# Patient Record
Sex: Female | Born: 1945 | Race: White | Hispanic: No | Smoking: Never smoker
Health system: Southern US, Community
[De-identification: ages and names within clinical notes are randomized; demographics above are authoritative.]

## PROBLEM LIST (undated history)

## (undated) DIAGNOSIS — R51 Headache: Secondary | ICD-10-CM

## (undated) DIAGNOSIS — K9089 Other intestinal malabsorption: Secondary | ICD-10-CM

## (undated) DIAGNOSIS — I499 Cardiac arrhythmia, unspecified: Secondary | ICD-10-CM

## (undated) DIAGNOSIS — I10 Essential (primary) hypertension: Secondary | ICD-10-CM

## (undated) DIAGNOSIS — G4730 Sleep apnea, unspecified: Secondary | ICD-10-CM

## (undated) DIAGNOSIS — N390 Urinary tract infection, site not specified: Secondary | ICD-10-CM

## (undated) DIAGNOSIS — Z8489 Family history of other specified conditions: Secondary | ICD-10-CM

## (undated) DIAGNOSIS — D649 Anemia, unspecified: Secondary | ICD-10-CM

## (undated) DIAGNOSIS — W19XXXA Unspecified fall, initial encounter: Secondary | ICD-10-CM

## (undated) DIAGNOSIS — I8390 Asymptomatic varicose veins of unspecified lower extremity: Secondary | ICD-10-CM

## (undated) DIAGNOSIS — M1990 Unspecified osteoarthritis, unspecified site: Secondary | ICD-10-CM

## (undated) DIAGNOSIS — F419 Anxiety disorder, unspecified: Secondary | ICD-10-CM

## (undated) HISTORY — PX: HERNIA REPAIR: SHX51

## (undated) HISTORY — DX: Urinary tract infection, site not specified: N39.0

## (undated) HISTORY — DX: Essential (primary) hypertension: I10

## (undated) HISTORY — DX: Asymptomatic varicose veins of unspecified lower extremity: I83.90

## (undated) HISTORY — DX: Other intestinal malabsorption: K90.89

## (undated) HISTORY — PX: NASAL SINUS SURGERY: SHX719

## (undated) HISTORY — PX: TONSILLECTOMY: SUR1361

## (undated) HISTORY — PX: CARPAL TUNNEL RELEASE: SHX101

---

## 1987-02-09 HISTORY — PX: ABDOMINAL HYSTERECTOMY: SHX81

## 1998-02-08 HISTORY — PX: GASTRIC BYPASS: SHX52

## 1998-02-08 HISTORY — PX: CYSTECTOMY: SUR359

## 1998-02-08 HISTORY — PX: CHOLECYSTECTOMY: SHX55

## 2006-02-08 HISTORY — PX: COLONOSCOPY: SHX174

## 2011-08-19 ENCOUNTER — Other Ambulatory Visit: Payer: Self-pay

## 2011-08-19 DIAGNOSIS — I83893 Varicose veins of bilateral lower extremities with other complications: Secondary | ICD-10-CM

## 2011-09-03 ENCOUNTER — Encounter: Payer: Self-pay | Admitting: Vascular Surgery

## 2011-09-15 ENCOUNTER — Encounter: Payer: Medicare Other | Admitting: Vascular Surgery

## 2011-10-21 ENCOUNTER — Encounter: Payer: Self-pay | Admitting: Vascular Surgery

## 2011-10-22 ENCOUNTER — Ambulatory Visit (INDEPENDENT_AMBULATORY_CARE_PROVIDER_SITE_OTHER): Payer: Medicare Other | Admitting: Vascular Surgery

## 2011-10-22 ENCOUNTER — Encounter (INDEPENDENT_AMBULATORY_CARE_PROVIDER_SITE_OTHER): Payer: Medicare Other | Admitting: *Deleted

## 2011-10-22 ENCOUNTER — Encounter: Payer: Self-pay | Admitting: Vascular Surgery

## 2011-10-22 VITALS — BP 143/87 | HR 73 | Resp 16 | Ht 63.5 in | Wt 214.4 lb

## 2011-10-22 DIAGNOSIS — M79604 Pain in right leg: Secondary | ICD-10-CM

## 2011-10-22 DIAGNOSIS — M79609 Pain in unspecified limb: Secondary | ICD-10-CM

## 2011-10-22 DIAGNOSIS — I83893 Varicose veins of bilateral lower extremities with other complications: Secondary | ICD-10-CM

## 2011-10-22 DIAGNOSIS — I872 Venous insufficiency (chronic) (peripheral): Secondary | ICD-10-CM

## 2011-10-22 DIAGNOSIS — R609 Edema, unspecified: Secondary | ICD-10-CM

## 2011-10-22 NOTE — Progress Notes (Signed)
[facility] [place]  Referred by:  Philemon Kingdom, MD [address] [place], [place] [address]  Reason for referral: R > L varicose veins  History of Present Illness  Rachel Castaneda is a 66 y.o. (Jul 04, 1945) female who presents with chief complaint: swollen leg.  Patient notes, onset of swelling >10 years ago, associated with no obvious trigger.  The patient has had no history of DVT, known history of varicose vein, no history of venous stasis ulcers, no history of  Lymphedema and no history of skin changes in lower legs.  There is known family history of venous disorders.  The patient has used OTC compression stockings in the past.  Past Medical History  Diagnosis Date  . Varicose veins   . Hypertension   . UTI (urinary tract infection)   . Diabetes mellitus     Type II  . Other specified intestinal malabsorption     Past Surgical History  Procedure Date  . Hernia repair 1997  and   2001  . Cystectomy 2000    Sinus cyst  . Gastric bypass 2000  . Cholecystectomy 2000    Gall Bladder  . Colonoscopy 2008  . Abdominal hysterectomy 1989    History   Social History  . Marital Status: Divorced    Spouse Name: N/A    Number of Children: N/A  . Years of Education: N/A   Occupational History  . Not on file.   Social History Main Topics  . Smoking status: Never Smoker   . Smokeless tobacco: Not on file  . Alcohol Use: No  . Drug Use: No  . Sexually Active:    Other Topics Concern  . Not on file   Social History Narrative  . No narrative on file    Family History  Problem Relation Age of Onset  . Cancer Mother     Skin cancer  . Stroke Mother   . Hypertension Mother     Current Outpatient Prescriptions on File Prior to Visit  Medication Sig Dispense Refill  . acetaminophen (TYLENOL) 500 MG tablet Take 500 mg by mouth every 6 (six) hours as needed.      Marland Kitchen aspirin 81 MG tablet Take 81 mg by mouth daily.      . Biotin 1000 MCG tablet Take  1,000 mcg by mouth 3 (three) times daily.      . Calcium Carbonate-Vitamin D 300-100 MG-UNIT CAPS Take 400 Units by mouth.      . Cyanocobalamin (VITAMIN B 12 PO) Take 2,500 mcg by mouth.      . ferrous sulfate 325 (65 FE) MG EC tablet Take 325 mg by mouth 3 (three) times daily with meals.      Marland Kitchen ibuprofen (ADVIL,MOTRIN) 200 MG tablet Take 200 mg by mouth every 6 (six) hours as needed.      . Multiple Vitamin (MULTIVITAMIN) tablet Take 1 tablet by mouth daily.      . Potassium 99 MG TABS Take by mouth.        Allergies  Allergen Reactions  . Codeine Hives    REVIEW OF SYSTEMS:  (Positives checked otherwise negative)  CARDIOVASCULAR:  [ ]  chest pain, [ ]  chest pressure, [ ]  palpitations, [ ]  shortness of breath when laying flat, [ ]  shortness of breath with exertion,   [x]  pain in feet when walking, [ ]  pain in feet when laying flat, [ ]  history of blood clot in veins (DVT), [ ]  history of  phlebitis, [x]  swelling in legs, [x]  varicose veins  PULMONARY:  [ ]  productive cough, [ ]  asthma, [ ]  wheezing  NEUROLOGIC:  [ ]  weakness in arms or legs, [ ]  numbness in arms or legs, [ ]  difficulty speaking or slurred speech, [ ]  temporary loss of vision in one eye, [ ]  dizziness  HEMATOLOGIC:  [ ]  bleeding problems, [ ]  problems with blood clotting too easily  MUSCULOSKEL:  [ ]  joint pain, [ ]  joint swelling  GASTROINTEST:  [ ]   Vomiting blood, [ ]   Blood in stool     GENITOURINARY:  [ ]   Burning with urination, [ ]   Blood in urine  PSYCHIATRIC:  [ ]  history of major depression  INTEGUMENTARY:  [ ]  rashes, [ ]  ulcers  CONSTITUTIONAL:  [ ]  fever, [ ]  chills  Physical Examination  Filed Vitals:   10/22/11 1536  BP: 143/87  Pulse: 73  Resp: 16  Height: 5' 3.5" (1.613 m)  Weight: 214 lb 6.4 oz (97.251 kg)  SpO2: 96%   Body mass index is 37.38 kg/(m^2).  General: A&O x 3, WDWN, obese  Head: [place]/AT  Ear/Nose/Throat: Hearing grossly intact, nares w/o erythema or drainage, oropharynx  w/o Erythema/Exudate  Eyes: PERRLA, EOMI  Neck: Supple, no nuchal rigidity, no palpable LAD  Pulmonary: Sym exp, good air movt, CTAB, no rales, rhonchi, & wheezing  Cardiac: RRR, Nl S1, S2, no Murmurs, rubs or gallops  Vascular: Vessel Right Left  Radial Palpable Palpable  Ulnar Palpable Palpable  Brachial Palpable Palpable  Carotid Palpable, without bruit Palpable, without bruit  Aorta Not palpable N/A  Femoral Palpable Palpable  Popliteal Not palpable Not palpable  PT Palpable Palpable  DP Palpable Palpable   Gastrointestinal: soft, NTND, -G/R, - HSM, - masses, - CVAT B  Musculoskeletal: M/S 5/5 throughout , Extremities without ischemic changes , R > LLE: varicosities, spider veins, no LDS, bulging in L posterior calf  Neurologic: CN 2-12 intact , Pain and light touch intact in extremities , Motor exam as listed above  Psychiatric: Judgment intact, Mood & affect appropriate for pt's clinical situation  Dermatologic: See M/S exam for extremity exam, no rashes otherwise noted  Lymph : No Cervical, Axillary, or Inguinal lymphadenopathy   Non-Invasive Vascular Imaging  BLE Venous Insufficiency Duplex (Date: 10/22/11):   RLE:no DVT, evidence of deep venous reflux and some limited superficial reflux, no SSV reflux  LLE: no DVT, evidence of deep venous reflux and some limited superficial reflux, no SSV reflux  Outside Studies/Documentation 2 pages of outside documents were reviewed including: outpatient chart.  Medical Decision Making  Rachel Castaneda is a 66 y.o. female who presents with: BLE CVI (C2).   Based on the patient's history and examination, I recommend: 3 months of B thigh high compression stockings 20-30 mm Hg..  After three month trial of compression stockings, we will have her follow up with Vein Clinic for consideration of EVLA vs phlebectomy  Thank you for allowing [place] to participate in this patient's care.  Leonides Sake, MD [facility] [place] Office: [phone] Pager: [phone]  10/22/2011, 4:02 PM

## 2012-01-25 ENCOUNTER — Ambulatory Visit: Payer: Medicare Other | Admitting: Vascular Surgery

## 2012-02-09 HISTORY — PX: BACK SURGERY: SHX140

## 2012-02-18 ENCOUNTER — Other Ambulatory Visit: Payer: Self-pay | Admitting: Neurosurgery

## 2012-02-18 DIAGNOSIS — M549 Dorsalgia, unspecified: Secondary | ICD-10-CM

## 2012-02-21 ENCOUNTER — Ambulatory Visit
Admission: RE | Admit: 2012-02-21 | Discharge: 2012-02-21 | Disposition: A | Discharge status: Home / Self Care | Payer: Medicare Other | Source: Ambulatory Visit | Attending: Neurosurgery | Admitting: Neurosurgery

## 2012-02-21 VITALS — BP 155/75 | HR 78

## 2012-02-21 DIAGNOSIS — M549 Dorsalgia, unspecified: Secondary | ICD-10-CM

## 2012-02-21 MED ORDER — IOHEXOL 180 MG/ML  SOLN
1.0000 mL | Freq: Once | INTRAMUSCULAR | Status: AC | PRN
  Administered 2012-02-21: 1 mL via EPIDURAL

## 2012-02-21 MED ORDER — DIAZEPAM 5 MG PO TABS
5.0000 mg | ORAL_TABLET | Freq: Once | ORAL | Status: AC
  Administered 2012-02-21: 5 mg via ORAL

## 2012-02-21 MED ORDER — METHYLPREDNISOLONE ACETATE 40 MG/ML INJ SUSP (RADIOLOG
120.0000 mg | Freq: Once | INTRAMUSCULAR | Status: AC
  Administered 2012-02-21: 120 mg via EPIDURAL

## 2012-03-20 ENCOUNTER — Other Ambulatory Visit: Payer: Self-pay | Admitting: Neurosurgery

## 2012-03-31 ENCOUNTER — Encounter (HOSPITAL_COMMUNITY): Payer: Self-pay | Admitting: Respiratory Therapy

## 2012-04-06 ENCOUNTER — Encounter (HOSPITAL_COMMUNITY): Payer: Self-pay

## 2012-04-06 ENCOUNTER — Encounter (HOSPITAL_COMMUNITY)
Admission: RE | Admit: 2012-04-06 | Discharge: 2012-04-06 | Disposition: A | Discharge status: Home / Self Care | Payer: Medicare Other | Source: Ambulatory Visit | Attending: Neurosurgery | Admitting: Neurosurgery

## 2012-04-06 HISTORY — DX: Anemia, unspecified: D64.9

## 2012-04-06 HISTORY — DX: Sleep apnea, unspecified: G47.30

## 2012-04-06 HISTORY — DX: Family history of other specified conditions: Z84.89

## 2012-04-06 HISTORY — DX: Headache: R51

## 2012-04-06 HISTORY — DX: Unspecified osteoarthritis, unspecified site: M19.90

## 2012-04-06 HISTORY — DX: Anxiety disorder, unspecified: F41.9

## 2012-04-06 HISTORY — DX: Unspecified fall, initial encounter: W19.XXXA

## 2012-04-06 LAB — BASIC METABOLIC PANEL
BUN: 15 mg/dL (ref 6–23)
CO2: 32 mEq/L (ref 19–32)
Calcium: 9.5 mg/dL (ref 8.4–10.5)
Chloride: 103 mEq/L (ref 96–112)
Creatinine, Ser: 0.54 mg/dL (ref 0.50–1.10)
GFR calc Af Amer: >90 mL/min (ref >90)
GFR calc non Af Amer: >90 mL/min (ref >90)
Glucose, Bld: 99 mg/dL (ref 70–99)
Potassium: 4.4 mEq/L (ref 3.5–5.1)
Sodium: 142 mEq/L (ref 135–145)

## 2012-04-06 LAB — CBC
HCT: 42.3 % (ref 36.0–46.0)
Hemoglobin: 15 g/dL (ref 12.0–15.0)
MCH: 33.6 pg (ref 26.0–34.0)
MCHC: 35.5 g/dL (ref 30.0–36.0)
MCV: 94.6 fL (ref 78.0–100.0)
Platelets: 217 10*3/uL (ref 150–400)
RBC: 4.47 MIL/uL (ref 3.87–5.11)
RDW: 12 % (ref 11.5–15.5)
WBC: 7.7 10*3/uL (ref 4.0–10.5)

## 2012-04-06 LAB — TYPE AND SCREEN
ABO/RH(D): O POS
Antibody Screen: NEGATIVE

## 2012-04-06 LAB — ABO/RH: ABO/RH(D): O POS

## 2012-04-06 LAB — SURGICAL PCR SCREEN
MRSA, PCR: NEGATIVE
Staphylococcus aureus: NEGATIVE

## 2012-04-06 NOTE — Pre-Procedure Instructions (Signed)
ALAHNA DUNNE  04/06/2012   Your procedure is scheduled on:  04/14/2012, Friday  Report to [facility] at 5:30 AM.  Call this number if you have problems the morning of surgery: [phone]   Remember:   Do not eat food or drink liquids after midnight.  On THursday    Take these medicines the morning of surgery with A SIP OF WATER: pain medicine if desired    Do not wear jewelry, make-up or nail polish.  Do not wear lotions, powders, or perfumes. You may wear deodorant.  Do not shave 48 hours prior to surgery. Men may shave face and neck.  Do not bring valuables to the hospital.  Contacts, dentures or bridgework may not be worn into surgery.  Leave suitcase in the car. After surgery it may be brought to your room.  For patients admitted to the hospital, checkout time is 11:00 AM the day of  discharge.   Patients discharged the day of surgery will not be allowed to drive  home.  Name and phone number of your driver: /w family  Special Instructions: Shower using CHG 2 nights before surgery and the night before surgery.  If you shower the day of surgery use CHG.  Use special wash - you have one bottle of CHG for all showers.  You should use approximately 1/3 of the bottle for each shower.   Please read over the following fact sheets that you were given: Pain Booklet, Coughing and Deep Breathing, Blood Transfusion Information, MRSA Information and Surgical Site Infection Prevention

## 2012-04-06 NOTE — Progress Notes (Signed)
Spoke with Shanda Bumps, reporting orders need to be signed.

## 2012-04-06 NOTE — Progress Notes (Signed)
Call to [facility] for Dr. Jeral Fruit to sign orders, on hold extended period of the time.

## 2012-04-06 NOTE — Progress Notes (Signed)
Pt. Reports her last EKG was several yrs. Ago in [place].

## 2012-04-06 NOTE — Progress Notes (Signed)
Call to MD office, related to Dr. Jeral Fruit needing to sign the orders.

## 2012-04-06 NOTE — Progress Notes (Signed)
Pt. Denies cardiac symptoms, has already stopped aspirin.

## 2012-04-07 NOTE — Progress Notes (Signed)
Call from pt. remarking that last night she experienced awaking gasping for air. Pt. Concerned that she has periodic episodes of apnea. Pt. Reassured that she will be given resp. support for her needs accordingly while in the [facility]. Pt. Encouraged to report the same to the asnesthesia team DOS.

## 2012-04-10 NOTE — Progress Notes (Signed)
1256.Marland KitchenMarland KitchenI spoke with Shanda Bumps @ office to have Dr. Jeral Fruit sign  And release orders.........da

## 2012-04-14 ENCOUNTER — Encounter (HOSPITAL_COMMUNITY): Payer: Self-pay | Admitting: *Deleted

## 2012-04-14 ENCOUNTER — Inpatient Hospital Stay (HOSPITAL_COMMUNITY)
Admission: RE | Admit: 2012-04-14 | Discharge: 2012-04-18 | DRG: 460 | Disposition: A | Discharge status: Home Health | Payer: Medicare Other | Source: Ambulatory Visit | Attending: Neurosurgery | Admitting: Neurosurgery

## 2012-04-14 ENCOUNTER — Inpatient Hospital Stay (HOSPITAL_COMMUNITY): Payer: Medicare Other

## 2012-04-14 ENCOUNTER — Inpatient Hospital Stay (HOSPITAL_COMMUNITY): Payer: Medicare Other | Admitting: Anesthesiology

## 2012-04-14 ENCOUNTER — Encounter (HOSPITAL_COMMUNITY): Payer: Self-pay | Admitting: Anesthesiology

## 2012-04-14 ENCOUNTER — Encounter (HOSPITAL_COMMUNITY)
Admission: RE | Disposition: A | Discharge status: Home Health | Payer: Self-pay | Source: Ambulatory Visit | Attending: Neurosurgery

## 2012-04-14 DIAGNOSIS — Z7982 Long term (current) use of aspirin: Secondary | ICD-10-CM

## 2012-04-14 DIAGNOSIS — Z01812 Encounter for preprocedural laboratory examination: Secondary | ICD-10-CM

## 2012-04-14 DIAGNOSIS — Q762 Congenital spondylolisthesis: Principal | ICD-10-CM

## 2012-04-14 DIAGNOSIS — Z79899 Other long term (current) drug therapy: Secondary | ICD-10-CM

## 2012-04-14 DIAGNOSIS — M7130 Other bursal cyst, unspecified site: Secondary | ICD-10-CM | POA: Diagnosis present

## 2012-04-14 DIAGNOSIS — Z9884 Bariatric surgery status: Secondary | ICD-10-CM

## 2012-04-14 DIAGNOSIS — D649 Anemia, unspecified: Secondary | ICD-10-CM | POA: Diagnosis present

## 2012-04-14 IMAGING — RF DG LUMBAR SPINE 2-3V
1 series · 2 of 2 positions shown · non-contrast
Comparison: MRI lumbar spine [DATE].

CLINICAL DATA: L4-5 fusion.

DG C-ARM 1-60 MIN, LUMBAR SPINE - 2-3 VIEW
TECHNIQUE: Two fluoroscopic intraoperative spot views of the lower
lumbar spine are provided.

[Series 1: run · 2 of 2 slices shown]
[im 1/2]
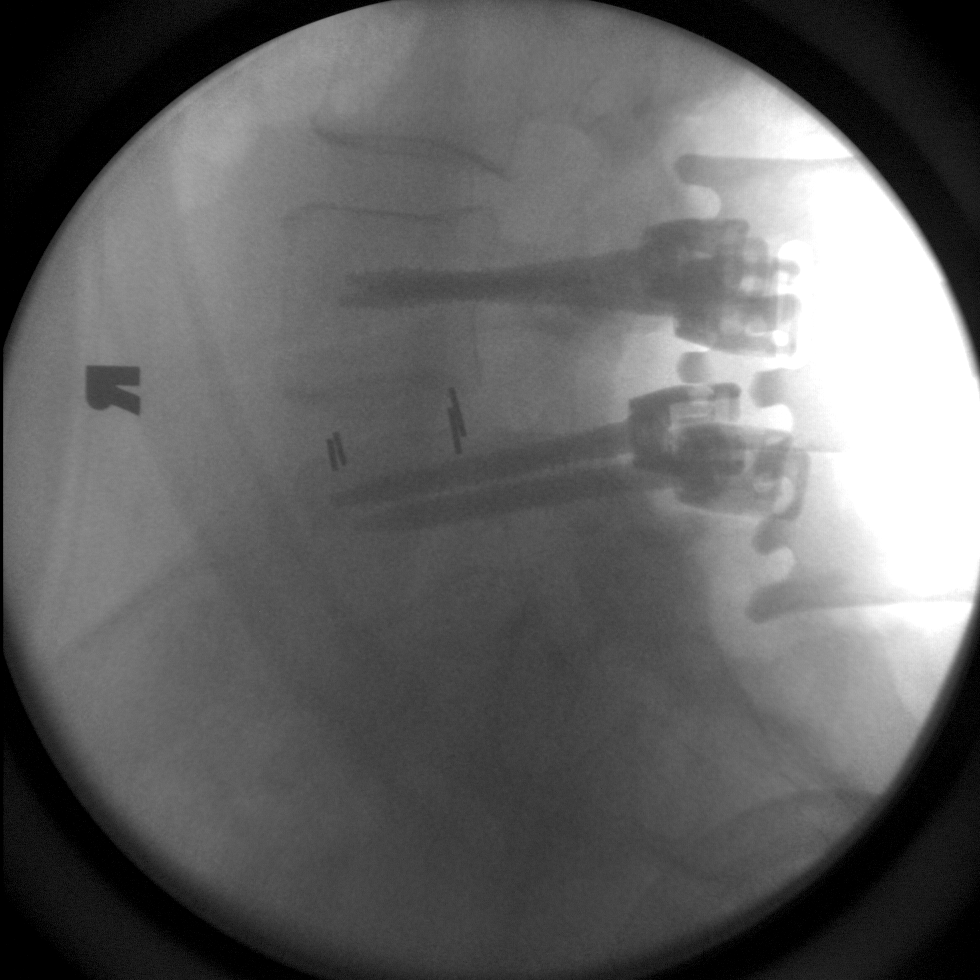
[im 2/2]
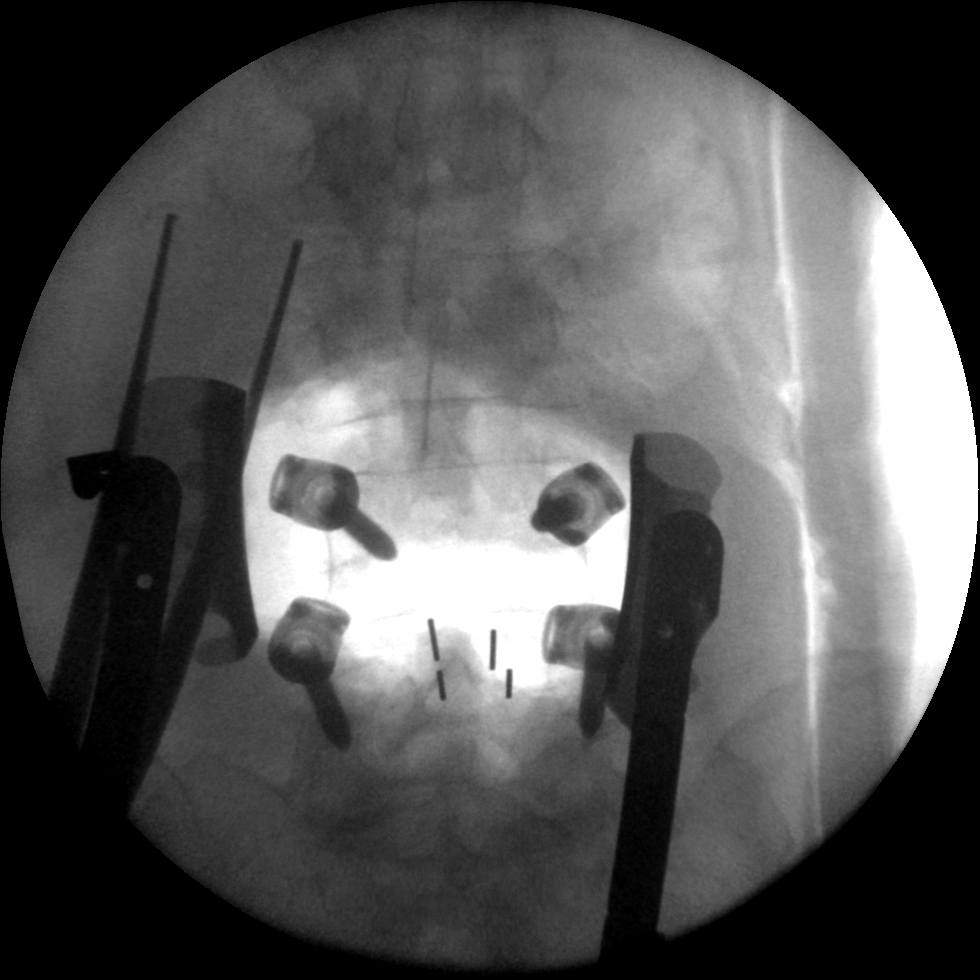

[2 of 2 positions shown; findings below may reference images not displayed]

FINDINGS: Provided images demonstrate pedicle screws at L4-5 with
interbody spacer in place.  Trace anterolisthesis of L4-L5 is not
visible on this examination. There is no fracture.
IMPRESSION: L4-5 fusion in progress.

## 2012-04-14 SURGERY — POSTERIOR LUMBAR FUSION 1 LEVEL
Anesthesia: General | Site: Back | Wound class: Clean

## 2012-04-14 MED ORDER — OXYCODONE HCL 5 MG PO TABS
ORAL_TABLET | ORAL | Status: AC
  Filled 2012-04-14: qty 1

## 2012-04-14 MED ORDER — PROMETHAZINE HCL 25 MG/ML IJ SOLN: 6.2500 mg | INTRAMUSCULAR | Status: DC | PRN

## 2012-04-14 MED ORDER — CEFAZOLIN SODIUM 1-5 GM-% IV SOLN
1.0000 g | Freq: Three times a day (TID) | INTRAVENOUS | Status: AC
  Administered 2012-04-14 – 2012-04-15 (×2): 1 g via INTRAVENOUS
  Filled 2012-04-14 (×2): qty 50

## 2012-04-14 MED ORDER — ACETAMINOPHEN 10 MG/ML IV SOLN
INTRAVENOUS | Status: AC
  Filled 2012-04-14: qty 100

## 2012-04-14 MED ORDER — SODIUM CHLORIDE 0.9 % IJ SOLN: 9.0000 mL | INTRAMUSCULAR | Status: DC | PRN

## 2012-04-14 MED ORDER — CEFAZOLIN SODIUM-DEXTROSE 2-3 GM-% IV SOLR
INTRAVENOUS | Status: AC
  Administered 2012-04-14: 2 g via INTRAVENOUS
  Filled 2012-04-14: qty 50

## 2012-04-14 MED ORDER — ROCURONIUM BROMIDE 100 MG/10ML IV SOLN
INTRAVENOUS | Status: DC | PRN
  Administered 2012-04-14 (×2): 10 mg via INTRAVENOUS
  Administered 2012-04-14: 50 mg via INTRAVENOUS

## 2012-04-14 MED ORDER — OXYCODONE HCL 5 MG/5ML PO SOLN: 5.0000 mg | Freq: Once | ORAL | Status: AC | PRN

## 2012-04-14 MED ORDER — SENNA 8.6 MG PO TABS
1.0000 | ORAL_TABLET | Freq: Two times a day (BID) | ORAL | Status: DC
  Administered 2012-04-14 – 2012-04-17 (×7): 8.6 mg via ORAL
  Filled 2012-04-14 (×11): qty 1

## 2012-04-14 MED ORDER — ARTIFICIAL TEARS OP OINT
TOPICAL_OINTMENT | OPHTHALMIC | Status: DC | PRN
  Administered 2012-04-14: 1 via OPHTHALMIC

## 2012-04-14 MED ORDER — ACETAMINOPHEN 325 MG PO TABS: 650.0000 mg | ORAL_TABLET | ORAL | Status: DC | PRN

## 2012-04-14 MED ORDER — SODIUM CHLORIDE 0.9 % IJ SOLN
3.0000 mL | Freq: Two times a day (BID) | INTRAMUSCULAR | Status: DC
  Administered 2012-04-15 – 2012-04-17 (×3): 3 mL via INTRAVENOUS

## 2012-04-14 MED ORDER — OXYCODONE-ACETAMINOPHEN 5-325 MG PO TABS
1.0000 | ORAL_TABLET | ORAL | Status: DC | PRN
  Administered 2012-04-15 – 2012-04-18 (×12): 2 via ORAL
  Filled 2012-04-14 (×12): qty 2

## 2012-04-14 MED ORDER — DIPHENHYDRAMINE HCL 50 MG/ML IJ SOLN: 12.5000 mg | Freq: Four times a day (QID) | INTRAMUSCULAR | Status: DC | PRN

## 2012-04-14 MED ORDER — DIPHENHYDRAMINE HCL 12.5 MG/5ML PO ELIX: 12.5000 mg | ORAL_SOLUTION | Freq: Four times a day (QID) | ORAL | Status: DC | PRN

## 2012-04-14 MED ORDER — ZOLPIDEM TARTRATE 5 MG PO TABS
5.0000 mg | ORAL_TABLET | Freq: Every evening | ORAL | Status: DC | PRN
  Administered 2012-04-15: 5 mg via ORAL
  Filled 2012-04-14: qty 1

## 2012-04-14 MED ORDER — MORPHINE SULFATE (PF) 1 MG/ML IV SOLN
INTRAVENOUS | Status: AC
  Filled 2012-04-14: qty 25

## 2012-04-14 MED ORDER — HYDROMORPHONE HCL PF 1 MG/ML IJ SOLN
INTRAMUSCULAR | Status: AC
  Filled 2012-04-14: qty 1

## 2012-04-14 MED ORDER — MORPHINE SULFATE (PF) 1 MG/ML IV SOLN
INTRAVENOUS | Status: DC
  Administered 2012-04-14: 14:00:00 via INTRAVENOUS
  Administered 2012-04-14: 4.5 mg via INTRAVENOUS
  Administered 2012-04-14: 21:00:00 via INTRAVENOUS
  Administered 2012-04-14: 10.5 mg via INTRAVENOUS
  Administered 2012-04-14: 6 mg via INTRAVENOUS
  Administered 2012-04-14: 10.48 mg via INTRAVENOUS
  Administered 2012-04-15: 25 mg via INTRAVENOUS
  Administered 2012-04-15: 1.5 mg via INTRAVENOUS
  Administered 2012-04-15: 6 mg via INTRAVENOUS
  Administered 2012-04-15: 14.97 mg via INTRAVENOUS
  Administered 2012-04-15: 7.5 mg via INTRAVENOUS
  Administered 2012-04-15: 9 mg via INTRAVENOUS
  Administered 2012-04-16: 01:00:00 via INTRAVENOUS
  Administered 2012-04-16: 22.45 mg via INTRAVENOUS
  Filled 2012-04-14 (×4): qty 25

## 2012-04-14 MED ORDER — GLYCOPYRROLATE 0.2 MG/ML IJ SOLN
INTRAMUSCULAR | Status: DC | PRN
  Administered 2012-04-14: .6 mg via INTRAVENOUS

## 2012-04-14 MED ORDER — ONDANSETRON HCL 4 MG/2ML IJ SOLN: 4.0000 mg | INTRAMUSCULAR | Status: DC | PRN

## 2012-04-14 MED ORDER — ONDANSETRON HCL 4 MG/2ML IJ SOLN: 4.0000 mg | Freq: Four times a day (QID) | INTRAMUSCULAR | Status: DC | PRN

## 2012-04-14 MED ORDER — ACETAMINOPHEN 650 MG RE SUPP: 650.0000 mg | RECTAL | Status: DC | PRN

## 2012-04-14 MED ORDER — BUPIVACAINE LIPOSOME 1.3 % IJ SUSP
20.0000 mL | INTRAMUSCULAR | Status: AC
  Administered 2012-04-14: 2 mL
  Filled 2012-04-14: qty 20

## 2012-04-14 MED ORDER — SODIUM CHLORIDE 0.9 % IJ SOLN: 3.0000 mL | INTRAMUSCULAR | Status: DC | PRN

## 2012-04-14 MED ORDER — MEPERIDINE HCL 25 MG/ML IJ SOLN: 6.2500 mg | INTRAMUSCULAR | Status: DC | PRN

## 2012-04-14 MED ORDER — HYDROMORPHONE HCL PF 1 MG/ML IJ SOLN
0.2500 mg | INTRAMUSCULAR | Status: DC | PRN
  Administered 2012-04-14 (×4): 0.5 mg via INTRAVENOUS

## 2012-04-14 MED ORDER — NEOSTIGMINE METHYLSULFATE 1 MG/ML IJ SOLN
INTRAMUSCULAR | Status: DC | PRN
  Administered 2012-04-14: 5 mg via INTRAVENOUS

## 2012-04-14 MED ORDER — ONDANSETRON HCL 4 MG/2ML IJ SOLN
INTRAMUSCULAR | Status: DC | PRN
  Administered 2012-04-14: 4 mg via INTRAVENOUS

## 2012-04-14 MED ORDER — PHENOL 1.4 % MT LIQD: 1.0000 | OROMUCOSAL | Status: DC | PRN

## 2012-04-14 MED ORDER — LIDOCAINE HCL (CARDIAC) 20 MG/ML IV SOLN
INTRAVENOUS | Status: DC | PRN
  Administered 2012-04-14: 100 mg via INTRAVENOUS

## 2012-04-14 MED ORDER — LACTATED RINGERS IV SOLN
INTRAVENOUS | Status: DC | PRN
  Administered 2012-04-14 (×3): via INTRAVENOUS

## 2012-04-14 MED ORDER — MIDAZOLAM HCL 5 MG/5ML IJ SOLN
INTRAMUSCULAR | Status: DC | PRN
  Administered 2012-04-14: 2 mg via INTRAVENOUS

## 2012-04-14 MED ORDER — OXYCODONE HCL 5 MG PO TABS
5.0000 mg | ORAL_TABLET | Freq: Once | ORAL | Status: DC | PRN
  Administered 2012-04-14: 5 mg via ORAL

## 2012-04-14 MED ORDER — PROPOFOL 10 MG/ML IV BOLUS
INTRAVENOUS | Status: DC | PRN
  Administered 2012-04-14: 150 mg via INTRAVENOUS

## 2012-04-14 MED ORDER — THROMBIN 20000 UNITS EX SOLR
CUTANEOUS | Status: DC | PRN
  Administered 2012-04-14: 11:00:00 via TOPICAL

## 2012-04-14 MED ORDER — SODIUM CHLORIDE 0.9 % IV SOLN
250.0000 mL | INTRAVENOUS | Status: DC
  Administered 2012-04-14: 250 mL via INTRAVENOUS

## 2012-04-14 MED ORDER — THROMBIN 5000 UNITS EX SOLR
OROMUCOSAL | Status: DC | PRN
  Administered 2012-04-14: 11:00:00 via TOPICAL

## 2012-04-14 MED ORDER — SODIUM CHLORIDE 0.9 % IV SOLN
INTRAVENOUS | Status: DC
  Administered 2012-04-14 – 2012-04-15 (×2): via INTRAVENOUS
  Administered 2012-04-15: 1000 mL via INTRAVENOUS

## 2012-04-14 MED ORDER — FENTANYL CITRATE 0.05 MG/ML IJ SOLN
INTRAMUSCULAR | Status: DC | PRN
  Administered 2012-04-14: 50 ug via INTRAVENOUS
  Administered 2012-04-14: 100 ug via INTRAVENOUS
  Administered 2012-04-14 (×7): 50 ug via INTRAVENOUS

## 2012-04-14 MED ORDER — LUBIPROSTONE 24 MCG PO CAPS
24.0000 ug | ORAL_CAPSULE | Freq: Every day | ORAL | Status: DC
  Administered 2012-04-15 – 2012-04-17 (×3): 24 ug via ORAL
  Filled 2012-04-14 (×5): qty 1

## 2012-04-14 MED ORDER — MENTHOL 3 MG MT LOZG: 1.0000 | LOZENGE | OROMUCOSAL | Status: DC | PRN

## 2012-04-14 MED ORDER — NALOXONE HCL 0.4 MG/ML IJ SOLN: 0.4000 mg | INTRAMUSCULAR | Status: DC | PRN

## 2012-04-14 MED ORDER — 0.9 % SODIUM CHLORIDE (POUR BTL) OPTIME
TOPICAL | Status: DC | PRN
  Administered 2012-04-14: 1000 mL

## 2012-04-14 SURGICAL SUPPLY — 66 items
BENZOIN TINCTURE PRP APPL 2/3 (GAUZE/BANDAGES/DRESSINGS) ×2 IMPLANT
BLADE SURG ROTATE 9660 (MISCELLANEOUS) IMPLANT
BUR ACORN 6.0 (BURR) ×2 IMPLANT
BUR MATCHSTICK NEURO 3.0 LAGG (BURR) ×2 IMPLANT
CANISTER SUCTION 2500CC (MISCELLANEOUS) ×2 IMPLANT
CAP REVERE LOCKING (Cap) ×8 IMPLANT
CLOTH BEACON ORANGE TIMEOUT ST (SAFETY) ×2 IMPLANT
CONT SPEC 4OZ CLIKSEAL STRL BL (MISCELLANEOUS) ×2 IMPLANT
COVER BACK TABLE 24X17X13 BIG (DRAPES) IMPLANT
COVER TABLE BACK 60X90 (DRAPES) ×2 IMPLANT
DRAPE C-ARM 42X72 X-RAY (DRAPES) ×4 IMPLANT
DRAPE LAPAROTOMY 100X72X124 (DRAPES) ×2 IMPLANT
DRAPE POUCH INSTRU U-SHP 10X18 (DRAPES) ×2 IMPLANT
DRSG PAD ABDOMINAL 8X10 ST (GAUZE/BANDAGES/DRESSINGS) ×2 IMPLANT
DURAPREP 26ML APPLICATOR (WOUND CARE) ×2 IMPLANT
ELECT REM PT RETURN 9FT ADLT (ELECTROSURGICAL) ×2
ELECTRODE REM PT RTRN 9FT ADLT (ELECTROSURGICAL) ×1 IMPLANT
EVACUATOR 1/8 PVC DRAIN (DRAIN) IMPLANT
EVACUATOR 3/16  PVC DRAIN (DRAIN) ×1
EVACUATOR 3/16 PVC DRAIN (DRAIN) ×1 IMPLANT
GAUZE SPONGE 4X4 16PLY XRAY LF (GAUZE/BANDAGES/DRESSINGS) ×4 IMPLANT
GLOVE BIOGEL M 8.0 STRL (GLOVE) ×2 IMPLANT
GLOVE BIOGEL PI IND STRL 8.5 (GLOVE) ×1 IMPLANT
GLOVE BIOGEL PI INDICATOR 8.5 (GLOVE) ×1
GLOVE EXAM NITRILE LRG STRL (GLOVE) IMPLANT
GLOVE EXAM NITRILE MD LF STRL (GLOVE) ×2 IMPLANT
GLOVE EXAM NITRILE XL STR (GLOVE) IMPLANT
GLOVE EXAM NITRILE XS STR PU (GLOVE) IMPLANT
GLOVE SURG SS PI 7.5 STRL IVOR (GLOVE) ×6 IMPLANT
GOWN BRE IMP SLV AUR LG STRL (GOWN DISPOSABLE) ×2 IMPLANT
GOWN BRE IMP SLV AUR XL STRL (GOWN DISPOSABLE) IMPLANT
GOWN PREVENTION PLUS XXLARGE (GOWN DISPOSABLE) ×2 IMPLANT
GOWN STRL REIN 2XL LVL4 (GOWN DISPOSABLE) IMPLANT
KIT BASIN OR (CUSTOM PROCEDURE TRAY) ×2 IMPLANT
KIT ROOM TURNOVER OR (KITS) ×2 IMPLANT
MILL MEDIUM DISP (BLADE) ×2 IMPLANT
NEEDLE HYPO 18GX1.5 BLUNT FILL (NEEDLE) IMPLANT
NEEDLE HYPO 21X1.5 SAFETY (NEEDLE) ×2 IMPLANT
NEEDLE HYPO 25X1 1.5 SAFETY (NEEDLE) IMPLANT
NS IRRIG 1000ML POUR BTL (IV SOLUTION) ×2 IMPLANT
PACK LAMINECTOMY NEURO (CUSTOM PROCEDURE TRAY) ×2 IMPLANT
PAD ARMBOARD 7.5X6 YLW CONV (MISCELLANEOUS) ×6 IMPLANT
PATTIES SURGICAL .5 X1 (DISPOSABLE) ×2 IMPLANT
PATTIES SURGICAL .5 X3 (DISPOSABLE) IMPLANT
PUTTY BONE GRAFT KIT 2.5ML (Bone Implant) ×2 IMPLANT
ROD REVERE 6.35 40MM (Rod) ×4 IMPLANT
SCREW REVERE 5.5X45 (Screw) ×8 IMPLANT
SPACER SUSTAIN O SML 8X22 12MM (Spacer) ×4 IMPLANT
SPONGE GAUZE 4X4 12PLY (GAUZE/BANDAGES/DRESSINGS) ×2 IMPLANT
SPONGE LAP 4X18 X RAY DECT (DISPOSABLE) IMPLANT
SPONGE NEURO XRAY DETECT 1X3 (DISPOSABLE) IMPLANT
SPONGE SURGIFOAM ABS GEL 100 (HEMOSTASIS) ×2 IMPLANT
STRIP CLOSURE SKIN 1/2X4 (GAUZE/BANDAGES/DRESSINGS) ×2 IMPLANT
SUT VIC AB 1 CT1 18XBRD ANBCTR (SUTURE) ×2 IMPLANT
SUT VIC AB 1 CT1 8-18 (SUTURE) ×2
SUT VIC AB 2-0 CP2 18 (SUTURE) ×2 IMPLANT
SUT VIC AB 3-0 SH 8-18 (SUTURE) ×2 IMPLANT
SYR 20CC LL (SYRINGE) ×2 IMPLANT
SYR 20ML ECCENTRIC (SYRINGE) ×2 IMPLANT
SYR 5ML LL (SYRINGE) IMPLANT
TAPE CLOTH SURG 4X10 WHT LF (GAUZE/BANDAGES/DRESSINGS) ×2 IMPLANT
TAPE STRIPS DRAPE STRL (GAUZE/BANDAGES/DRESSINGS) ×2 IMPLANT
TOWEL OR 17X24 6PK STRL BLUE (TOWEL DISPOSABLE) ×2 IMPLANT
TOWEL OR 17X26 10 PK STRL BLUE (TOWEL DISPOSABLE) ×2 IMPLANT
TRAY FOLEY CATH 14FRSI W/METER (CATHETERS) ×2 IMPLANT
WATER STERILE IRR 1000ML POUR (IV SOLUTION) ×2 IMPLANT

## 2012-04-14 NOTE — Anesthesia Procedure Notes (Signed)
Procedure Name: Intubation Date/Time: 04/14/2012 9:55 AM Performed by: Sherie Don Pre-anesthesia Checklist: Patient identified, Emergency Drugs available, Suction available, Patient being monitored and Timeout performed Patient Re-evaluated:Patient Re-evaluated prior to inductionOxygen Delivery Method: Circle system utilized Preoxygenation: Pre-oxygenation with 100% oxygen Intubation Type: IV induction Ventilation: Mask ventilation without difficulty Laryngoscope Size: Mac and 3 Grade View: Grade II Tube type: Oral Tube size: 7.5 mm Number of attempts: 1 Airway Equipment and Method: Stylet Placement Confirmation: ETT inserted through vocal cords under direct vision,  positive ETCO2 and breath sounds checked- equal and bilateral Secured at: 23 cm Tube secured with: Tape Dental Injury: Teeth and Oropharynx as per pre-operative assessment

## 2012-04-14 NOTE — Progress Notes (Signed)
Pt has been kept informed of delay.

## 2012-04-14 NOTE — Preoperative (Signed)
Beta Blockers   Reason not to administer Beta Blockers:Not Applicable 

## 2012-04-14 NOTE — Anesthesia Postprocedure Evaluation (Signed)
  Anesthesia Post-op Note  Patient: Rachel Castaneda  Procedure(s) Performed: Procedure(s) with comments: POSTERIOR LUMBAR FUSION 1 LEVEL (N/A) - L4 Gill procedure/L4-5 Diskectomy/fusion/Pedicle screws/posterolateral arthrodesis with autograft/cellsaver  Patient Location: PACU  Anesthesia Type:General  Level of Consciousness: awake and alert   Airway and Oxygen Therapy: Patient Spontanous Breathing  Post-op Pain: mild  Post-op Assessment: Post-op Vital signs reviewed  Post-op Vital Signs: stable  Complications: No apparent anesthesia complications

## 2012-04-14 NOTE — Anesthesia Preprocedure Evaluation (Addendum)
Anesthesia Evaluation  Patient identified by MRN, date of birth, ID band Patient awake    Reviewed: Allergy & Precautions, H&P , NPO status , Patient's Chart, lab work & pertinent test results  History of Anesthesia Complications (+) Family history of anesthesia reaction  Airway Mallampati: I  Neck ROM: Full    Dental  (+) Teeth Intact   Pulmonary sleep apnea ,  breath sounds clear to auscultation        Cardiovascular Rhythm:Regular Rate:Normal     Neuro/Psych  Headaches,    GI/Hepatic negative GI ROS, Neg liver ROS,   Endo/Other  diabetes  Renal/GU      Musculoskeletal   Abdominal   Peds  Hematology negative hematology ROS (+)   Anesthesia Other Findings   Reproductive/Obstetrics                          Anesthesia Physical Anesthesia Plan  ASA: II  Anesthesia Plan: General   Post-op Pain Management:    Induction: Intravenous  Airway Management Planned: Oral ETT  Additional Equipment:   Intra-op Plan:   Post-operative Plan: Extubation in OR  Informed Consent: I have reviewed the patients History and Physical, chart, labs and discussed the procedure including the risks, benefits and alternatives for the proposed anesthesia with the patient or authorized representative who has indicated his/her understanding and acceptance.   Dental advisory given  Plan Discussed with: CRNA and Surgeon  Anesthesia Plan Comments:         Anesthesia Quick Evaluation

## 2012-04-14 NOTE — Transfer of Care (Signed)
Immediate Anesthesia Transfer of Care Note  Patient: Rachel Castaneda  Procedure(s) Performed: Procedure(s) with comments: POSTERIOR LUMBAR FUSION 1 LEVEL (N/A) - L4 Gill procedure/L4-5 Diskectomy/fusion/Pedicle screws/posterolateral arthrodesis with autograft/cellsaver  Patient Location: PACU  Anesthesia Type:General  Level of Consciousness: patient cooperative  Airway & Oxygen Therapy: Patient Spontanous Breathing and Patient connected to face mask oxygen  Post-op Assessment: Report given to PACU RN, Post -op Vital signs reviewed and stable and Patient moving all extremities X 4  Post vital signs: Reviewed and stable  Complications: No apparent anesthesia complications

## 2012-04-14 NOTE — H&P (Signed)
Rachel Castaneda is an 67 y.o. female.   Chief Complaint: lumbar pain HPI: patient complaining of lumbar pain with radiation to the right legfor several years and lately is getting worse.he already had a second opinion and she wants to go ahead with surgery  Past Medical History  Diagnosis Date  . Varicose veins   . UTI (urinary tract infection)   . Other specified intestinal malabsorption   . Family history of anesthesia complication     daughter- extremely agitated   . Anxiety     pt. admits to anxiety, currently, not taking meds.   . Sleep apnea     used CPAP- prior to gastric bypass, no longer needs.  . Diabetes mellitus     Type II, prior to gastric bypass , off Metformin since 2008  . Fall     related to dog attack- Nov. 11.2013  . Headache     h/o migraines   . Arthritis     lumbar region  . Anemia   . Hypertension     HTN- prior to gastric bypass, seen by cardiologist- stress test done, last one mid 1990's- pt. reports it was WNL.    Past Surgical History  Procedure Laterality Date  . Hernia repair  1997  and   2001  . Cystectomy  2000    Sinus cyst  . Gastric bypass  2000  . Cholecystectomy  2000    Gall Bladder  . Colonoscopy  2008  . Abdominal hysterectomy  1989  . Carpal tunnel release      bilateral  . Tonsillectomy    . Cesarean section      x3    Family History  Problem Relation Age of Onset  . Cancer Mother     Skin cancer  . Stroke Mother   . Hypertension Mother    Social History:  reports that she has never smoked. She does not have any smokeless tobacco history on file. She reports that she does not drink alcohol or use illicit drugs.  Allergies:  Allergies  Allergen Reactions  . Codeine Hives  . Dairy Aid (Lactase)     Gassy, rash  . Ibuprofen Itching and Swelling    Medications Prior to Admission  Medication Sig Dispense Refill  . ALPHA LIPOIC ACID PO Take 100 mg by mouth daily.      Marland Kitchen aspirin 81 MG tablet Take 81 mg by mouth  daily.      . Biotin 1000 MCG tablet Take 1,000 mcg by mouth daily.       . calcium carbonate (TUMS EX) 750 MG chewable tablet Chew 2 tablets by mouth 2 (two) times daily.      . cholecalciferol (VITAMIN D) 1000 UNITS tablet Take 1,000 Units by mouth daily.      . Cyanocobalamin (VITAMIN B 12 PO) Take 2,500 mcg by mouth once a week.       . doxycycline (VIBRA-TABS) 100 MG tablet Take 100 mg by mouth daily.      . ferrous sulfate 325 (65 FE) MG EC tablet Take 65 mg by mouth daily with breakfast.       . lubiprostone (AMITIZA) 24 MCG capsule Take 24 mcg by mouth daily with breakfast.       . metroNIDAZOLE (METROGEL) 0.75 % gel Apply 1 application topically daily.      . Multiple Vitamin (MULTIVITAMIN) tablet Take 1 tablet by mouth daily.      Marland Kitchen oxyCODONE-acetaminophen (PERCOCET) 7.5-325 MG per  tablet Take 1 tablet by mouth every 4 (four) hours as needed for pain.      . polyethylene glycol (MIRALAX / GLYCOLAX) packet Take 17 g by mouth daily after supper.       . Potassium 99 MG TABS Take 99 mg by mouth daily.       . Calcium Carbonate-Vitamin D 300-100 MG-UNIT CAPS Take 400 Units by mouth.        No results found for this or any previous visit (from the past 48 hour(s)). No results found.  Review of Systems  Constitutional: Negative.   HENT: Negative.   Eyes: Negative.   Respiratory: Negative.   Cardiovascular:       Arterial hypertension  Gastrointestinal: Positive for nausea.  Genitourinary: Negative.   Musculoskeletal: Positive for back pain.  Skin: Negative.   Neurological: Positive for sensory change and focal weakness.  Psychiatric/Behavioral: Negative.     Blood pressure 156/89, pulse 82, temperature 97.1 F (36.2 C), temperature source Oral, resp. rate 18, SpO2 98.00%. Physical Exam hent, nl, neck, nl. Cv, nl. Lungs, clear. Abdomen, soft. Extremities, nl. NEURO 4/5 df weakness of right foot. Dtr, nl. Sensory, nl. Mri grade 1 spondylolisthesif, facet arthropathy and a  synovial cyst at l4-5   Assessment/Plan Patient to have decompression and fusion at l4-5. She and her daughter who is a Psychologist, occupational are aware of risks and benefits  Rachel Castaneda M 04/14/2012, 9:09 AM

## 2012-04-15 MED ORDER — DIAZEPAM 5 MG PO TABS
5.0000 mg | ORAL_TABLET | Freq: Four times a day (QID) | ORAL | Status: DC | PRN
  Administered 2012-04-15 – 2012-04-18 (×7): 5 mg via ORAL
  Filled 2012-04-15 (×7): qty 1

## 2012-04-15 NOTE — Progress Notes (Signed)
Pt assisted to [facility],  Moving well, tolerated well, voided large amount of clear amber urine.  Returned to bed.

## 2012-04-15 NOTE — Op Note (Signed)
NAME:  Rachel Castaneda, Rachel Castaneda NO.:  [id number]  MEDICAL RECORD NO.:  [id number]  LOCATION:  [id number]                        FACILITY:  [facility]  PHYSICIAN:  Hilda Lias, M.D.   DATE OF BIRTH:  Jul 05, 1945  DATE OF PROCEDURE:  04/14/2012 DATE OF DISCHARGE:                              OPERATIVE REPORT   PREOPERATIVE DIAGNOSIS:  L4-5 spondylolisthesis, severe stenosis, right synovial cyst, and acute and chronic radiculopathy.  POSTOPERATIVE DIAGNOSIS:  L4-5 spondylolisthesis, severe stenosis, right synovial cyst, and acute and chronic radiculopathy.  PROCEDURE:  L4 Gill procedure, which involved removal of the spinous process, lamina facetectomy, bilateral L4-L5 diskectomy medial and laterally more than normal to be able to introduce 2 cages of 12 x 22, decompression of the thecal sac, bilateral foraminotomy, and removal of the synovial cyst attached to the right L5 nerve root.  Pedicle screws at L4-L5, posterolateral arthrodesis with autograft of bone extender.  SURGEON:  Hilda Lias, M.D.  ASSISTANT:  Danae Orleans. Venetia Maxon, M.D.  CLINICAL HISTORY:  Mrs. Rachel Castaneda is a lady who was seen in my office, complaining of back pain worsened on both legs.  The right worse than left one.  She has failed with conservative treatment.  MRI showed that she has severe stenosis at L4-5 with spondylolisthesis grade 1 and large synovial cyst in the right side.  Surgery was advised and the risk was fully explained to her and her daughter who is a Psychologist, occupational.  PROCEDURE IN DETAIL:  The patient was taken to the OR, and after intubation, she was positioned in a prone manner.  The patient's back was cleaned with Betadine and DuraPrep.  Drapes were applied.  Midline incision was made.  We were unable to feel any bony structure.  Incision was carried out all the way down until we found the spinous process and dissection was carried down laterally.  X-ray was done with the C-arm showed that  indeed we were right at the level of L4-5.  From then on, we started the [facility] procedure, removing the spinous process of L4, the lamina of L4 bilaterally, and the facet.  They were quite loose.  The patient had quite a bit of thickening of the yellow ligament and dissection was done.  At the level of L5, there was a large synovial cyst attached to the L5 nerve root.  The dissection was carried down and decompression was achieved.  Then, we entered the disk space 1st in the left side and then the right side with total gross resection medial and laterally.  The endplate were removed.  Then we were able to introduce 2 cages of 12 x 22.  The cages were with autograft and bone extender. Then, using the C-arm 1st in AP view and then a lateral view, we probed the pedicle of L4-L5.  We introduced 4 screws of 5.5 x 45.  Kept in place with a rod and Capps.  Then, we removed the periosteum of the L4-5 disk space laterally as well as the transverse process.  A mix of autograft and bone extender was used for arthrodesis. Valsalva up to 40 was negative.  A Hemovac was left in the AP dural space  and the wound was closed with Vicryl and Steri-Strips.          ______________________________ Hilda Lias, M.D.     EB/MEDQ  D:  04/14/2012  T:  04/15/2012  Job:  [id number]

## 2012-04-15 NOTE — Clinical Social Work Note (Signed)
CSW consult for SNF. PT recommendation for HHPT noted. Per chart review, pt has 24 hour supervision in place. CSW signing off as no other CSW needs identified at this time. Please re-consult if CSW needs arise.  Dellie Burns, MSW, LCSWA [phone] (Weekends 8:00am-4:30pm)

## 2012-04-15 NOTE — Evaluation (Signed)
Physical Therapy Evaluation Patient Details Name: Rachel Castaneda MRN: [id number] DOB: [birth year] Today's Date: 04/15/2012 Time: 7829-5621 PT Time Calculation (min): 31 min  PT Assessment / Plan / Recommendation Clinical Impression  Pt s/p L4-5PLIF with decr mobility secondary to incr pain, decr endurance and decr balance.  Will benefit from PT to address mobility Should progress well and be ok to go home with HHPT f/u and family support.      PT Assessment  Patient needs continued PT services    Follow Up Recommendations  Home health PT;Supervision/Assistance - 24 hour                Equipment Recommendations  Other (comment) (3N1)         Frequency Min 6X/week    Precautions / Restrictions     Pertinent Vitals/Pain VSS, Some pain      Mobility  Bed Mobility Bed Mobility: Rolling Left;Left Sidelying to Sit;Sitting - Scoot to Edge of Bed Rolling Left: 1: +2 Total assist;With rail Rolling Left: Patient Percentage: 70% Left Sidelying to Sit: 1: +2 Total assist;HOB flat;With rails Left Sidelying to Sit: Patient Percentage: 60% Sitting - Scoot to Edge of Bed: 3: Mod assist Details for Bed Mobility Assistance: Pt needed cues for log roll as well as for sequencing.  Used pad to scoot pts hips to EOB.  Pt instructed in back precautions.  Pt assisted with donning brace as well at EOB.   Transfers Transfers: Sit to Stand;Stand to Sit Sit to Stand: 3: Mod assist;With upper extremity assist;From bed Stand to Sit: 4: Min assist;With upper extremity assist;With armrests;To chair/3-in-1 Details for Transfer Assistance: Pt needed cues for hand placement.  Ambulation/Gait Ambulation/Gait Assistance: 4: Min assist Ambulation Distance (Feet): 35 Feet Assistive device: Rolling walker Ambulation/Gait Assistance Details: Pt ambulated with RW with cues needed to stay close to RW.  Pt doing well overall needing cues for safety with RW.  Ambulated into bathroom.  Gait Pattern: Step-to  pattern;Trunk flexed;Decreased stride length;Decreased step length - right;Decreased step length - left;Wide base of support;Antalgic Gait velocity: decreased Stairs: No Wheelchair Mobility Wheelchair Mobility: No         PT Diagnosis: Generalized weakness  PT Problem List: Decreased activity tolerance;Decreased balance;Decreased mobility;Decreased knowledge of use of DME;Decreased safety awareness;Decreased knowledge of precautions;Pain PT Treatment Interventions: DME instruction;Patient/family education;Gait training;Stair training;Functional mobility training;Therapeutic activities;Therapeutic exercise;Balance training   PT Goals Acute Rehab PT Goals PT Goal Formulation: With patient Time For Goal Achievement: 04/22/12 Potential to Achieve Goals: Good Pt will Roll Supine to Left Side: Independently PT Goal: Rolling Supine to Left Side - Progress: Goal set today Pt will go Supine/Side to Sit: Independently PT Goal: Supine/Side to Sit - Progress: Goal set today Pt will go Sit to Stand: Independently;with upper extremity assist PT Goal: Sit to Stand - Progress: Goal set today Pt will Ambulate: >150 feet;with modified independence;with least restrictive assistive device PT Goal: Ambulate - Progress: Goal set today Pt will Go Up / Down Stairs: 3-5 stairs;with modified independence;with least restrictive assistive device PT Goal: Up/Down Stairs - Progress: Goal set today Pt will Perform Home Exercise Program: with supervision, verbal cues required/provided PT Goal: Perform Home Exercise Program - Progress: Goal set today Additional Goals Additional Goal #1: Pt to follow all back precautions.   PT Goal: Additional Goal #1 - Progress: Goal set today  Visit Information  Last PT Received On: 04/15/12 Assistance Needed: +2 PT/OT Co-Evaluation/Treatment: Yes    Subjective Data  Subjective: "I feel so  tired." Patient Stated Goal: To go home.   Prior Functioning  Home Living Lives  With: Spouse Available Help at Discharge: Family;Available 24 hours/day Type of Home: House Home Access: Stairs to enter [employer] of Steps: 4 Entrance Stairs-Rails: Can reach both Home Layout: One level Bathroom Shower/Tub: Engineer, manufacturing systems: Standard Home Adaptive Equipment: Walker - rolling Additional Comments: Has been using RW last few weeks per pt Prior Function Level of Independence: Independent with assistive device(s) Able to Take Stairs?: Yes Communication Communication: No difficulties    Cognition  Cognition Overall Cognitive Status: Appears within functional limits for tasks assessed/performed Arousal/Alertness: Awake/alert Orientation Level: Appears intact for tasks assessed Behavior During Session: [facility] for tasks performed    Extremity/Trunk Assessment Right Lower Extremity Assessment RLE ROM/Strength/Tone: [facility] for tasks assessed Left Lower Extremity Assessment LLE ROM/Strength/Tone: [facility] for tasks assessed Trunk Assessment Trunk Assessment: Normal   Balance Static Standing Balance Static Standing - Balance Support: Bilateral upper extremity supported;During functional activity Static Standing - Level of Assistance: 5: Stand by assistance Static Standing - Comment/# of Minutes: Stood 2 minutes statically with RW with good balance.   End of Session PT - End of Session Equipment Utilized During Treatment: Gait belt;Oxygen Activity Tolerance: Patient limited by fatigue;Patient limited by pain Patient left: in chair;with call bell/phone within reach;with family/visitor present Nurse Communication: Mobility status;Patient requests pain meds;Precautions       INGOLD,DAWN 04/15/2012, 1:18 PM [facility] Acute Rehabilitation [phone] (pager)

## 2012-04-15 NOTE — Plan of Care (Signed)
Problem: Consults Goal: Diagnosis - Spinal Surgery Outcome: Completed/Met Date Met:  04/15/12 [place]/Lumbar Spine Fusion     

## 2012-04-15 NOTE — Evaluation (Signed)
Occupational Therapy Evaluation Patient Details Name: Rachel Castaneda MRN: [id number] DOB: [birth year] Today's Date: 04/15/2012 Time: 1478-2956 OT Time Calculation (min): 32 min  OT Assessment / Plan / Recommendation Clinical Impression  Pt s/p L4-L5 PLIF. Will benefit from acute OT services to address below problem list in prep for return home with family.    OT Assessment  Patient needs continued OT Services    Follow Up Recommendations  No OT follow up;Supervision/Assistance - 24 hour    Barriers to Discharge None    Equipment Recommendations  3 in 1 bedside comode    Recommendations for Other Services    Frequency  Min 2X/week    Precautions / Restrictions     Pertinent Vitals/Pain See vitals    ADL  Eating/Feeding: Performed;Independent Where Assessed - Eating/Feeding: Chair Grooming: Performed;Wash/dry hands;Supervision/safety Where Assessed - Grooming: Unsupported standing Upper Body Bathing: Simulated;Supervision/safety Where Assessed - Upper Body Bathing: Unsupported sitting Lower Body Bathing: Simulated;Moderate assistance Where Assessed - Lower Body Bathing: Supported sit to stand Upper Body Dressing: Performed;Minimal assistance Where Assessed - Upper Body Dressing: Unsupported sitting Lower Body Dressing: Performed;Maximal assistance Where Assessed - Lower Body Dressing: Supported sit to stand Toilet Transfer: Performed;Minimal assistance Toilet Transfer Method: Sit to Barista: Raised toilet seat with arms (or 3-in-1 over toilet) Toileting - Clothing Manipulation and Hygiene: Performed;Min guard;Maximal assistance Where Assessed - Engineer, mining and Hygiene: Sit to stand from 3-in-1 or toilet Equipment Used: Gait belt;Rolling walker;Back brace Transfers/Ambulation Related to ADLs: min guard with RW ADL Comments: Pt donned back brace with mod assist while sitting EOB.  Min guard for standing during front peri care, but  required max assist for back peri care.  Discussed use of toilet aid for toileting hygiene. Will perform AE education next session.    OT Diagnosis: Generalized weakness;Acute pain  OT Problem List: Decreased strength;Decreased activity tolerance;Decreased safety awareness;Decreased knowledge of use of DME or AE;Decreased knowledge of precautions;Pain OT Treatment Interventions: Self-care/ADL training;DME and/or AE instruction;Therapeutic activities;Patient/family education;Balance training   OT Goals Acute Rehab OT Goals OT Goal Formulation: With patient Time For Goal Achievement: 04/22/12 Potential to Achieve Goals: Good ADL Goals Pt Will Perform Lower Body Bathing: with supervision;Sit to stand from chair;Sit to stand from bed;with adaptive equipment ADL Goal: Lower Body Bathing - Progress: Goal set today Pt Will Perform Lower Body Dressing: with supervision;Sit to stand from chair;Sit to stand from bed;with adaptive equipment ADL Goal: Lower Body Dressing - Progress: Goal set today Pt Will Transfer to Toilet: with supervision;Ambulation;with DME;Comfort height toilet;Maintaining back safety precautions ADL Goal: Toilet Transfer - Progress: Goal set today Pt Will Perform Toileting - Clothing Manipulation: with supervision;Standing ADL Goal: Toileting - Clothing Manipulation - Progress: Goal set today Pt Will Perform Toileting - Hygiene: with supervision;Sit to stand from 3-in-1/toilet;with adaptive equipment ADL Goal: Toileting - Hygiene - Progress: Goal set today Miscellaneous OT Goals Miscellaneous OT Goal #1: Pt will perform bed mobility at supervision level as precursor for EOB ADL retraining. OT Goal: Miscellaneous Goal #1 - Progress: Goal set today  Visit Information  Last OT Received On: 04/15/12 Assistance Needed: +2 PT/OT Co-Evaluation/Treatment: Yes    Subjective Data      Prior Functioning     Home Living Lives With: Spouse Available Help at Discharge:  Family;Available 24 hours/day Type of Home: House Home Access: Stairs to enter [employer] of Steps: 4 Entrance Stairs-Rails: Can reach both Home Layout: One level Bathroom Shower/Tub: Engineer, manufacturing systems: Standard Home  Adaptive Equipment: Walker - rolling Additional Comments: Has been using RW last few weeks per pt Prior Function Level of Independence: Independent with assistive device(s) Able to Take Stairs?: Yes Communication Communication: No difficulties Dominant Hand: Right         Vision/Perception     Cognition  Cognition Overall Cognitive Status: Appears within functional limits for tasks assessed/performed Arousal/Alertness: Awake/alert Orientation Level: Appears intact for tasks assessed Behavior During Session: [facility] for tasks performed    Extremity/Trunk Assessment Right Upper Extremity Assessment RUE ROM/Strength/Tone: [facility] for tasks assessed Left Upper Extremity Assessment LUE ROM/Strength/Tone: WFL for tasks assessed Right Lower Extremity Assessment RLE ROM/Strength/Tone: [facility] for tasks assessed Left Lower Extremity Assessment LLE ROM/Strength/Tone: [facility] for tasks assessed Trunk Assessment Trunk Assessment: Normal     Mobility Bed Mobility Bed Mobility: Rolling Left;Left Sidelying to Sit;Sitting - Scoot to Edge of Bed Rolling Left: 1: +2 Total assist;With rail Rolling Left: Patient Percentage: 70% Left Sidelying to Sit: 1: +2 Total assist;HOB flat;With rails Left Sidelying to Sit: Patient Percentage: 60% Sitting - Scoot to Edge of Bed: 3: Mod assist Details for Bed Mobility Assistance: Pt needed cues for log roll as well as for sequencing.  Used pad to scoot pts hips to EOB.  Pt instructed in back precautions.  Pt assisted with donning brace as well at EOB.   Transfers Transfers: Sit to Stand;Stand to Sit Sit to Stand: 3: Mod assist;4: Min assist;From bed;From chair/3-in-1;With armrests;With upper extremity assist Stand to Sit:  4: Min assist;To chair/3-in-1;With armrests;With upper extremity assist Details for Transfer Assistance: Mod assist from bed; min assist from 3n1 over toilet. VCs for safe hand placement.     Exercise     Balance Static Standing Balance Static Standing - Balance Support: Bilateral upper extremity supported;During functional activity Static Standing - Level of Assistance: 5: Stand by assistance Static Standing - Comment/# of Minutes: 2 min   End of Session OT - End of Session Equipment Utilized During Treatment: Gait belt;Back brace Activity Tolerance: Patient tolerated treatment well Patient left: in chair;with call bell/phone within reach;with family/visitor present Nurse Communication: Mobility status  GO    04/15/2012 Cipriano Mile OTR/L Pager [phone] Office [phone]' Cipriano Mile 04/15/2012, 3:11 PM

## 2012-04-15 NOTE — Progress Notes (Signed)
Patient ID: Rachel Castaneda, female   DOB: 03/27/1945, 67 y.o.   MRN: [id number] Having lumbar spasms, no weakness. Drain working well, no sensory changes. Spoke with her exhusband. Show them the operative xrays

## 2012-04-16 MED ORDER — WHITE PETROLATUM GEL
Status: AC
  Administered 2012-04-16: 20:00:00
  Filled 2012-04-16: qty 5

## 2012-04-16 MED ORDER — OXYCODONE HCL 5 MG PO TABS: 5.0000 mg | ORAL_TABLET | ORAL | Status: DC | PRN

## 2012-04-16 NOTE — Progress Notes (Signed)
OT Cancellation Note  Patient Details Name: Rachel Castaneda MRN: [id number] DOB: Jul 11, 1945   Cancelled Treatment:    Reason Eval/Treat Not Completed: Fatigue/lethargy limiting ability to participate. Pt falling asleep while talking to OT and unable to remain awake for session.  Will return next date to perform AE education.  04/16/2012 Cipriano Mile OTR/L Pager [phone] Office [phone]

## 2012-04-16 NOTE — Progress Notes (Signed)
Subjective: Patient reports doing well  Objective: Vital signs in last 24 hours: Temp:  [98.9 F (37.2 C)-100 F (37.8 C)] 98.9 F (37.2 C) (03/09 0200) Pulse Rate:  [85-122] 105 (03/09 0200) Resp:  [15-18] 16 (03/09 0200) BP: (91-127)/(51-82) 127/82 mmHg (03/09 0200) SpO2:  [93 %-100 %] 95 % (03/09 0200)  Intake/Output from previous day: 03/08 0701 - 03/09 0700 In: 840 [P.O.:840] Out: 1580 [Urine:1400; Drains:180] Intake/Output this shift:    Physical Exam: Full strength bilateral lower extremities.  Dressing CDI  Lab Results: No results found for this basename: WBC, HGB, HCT, PLT,  in the last 72 hours BMET No results found for this basename: NA, K, CL, CO2, GLUCOSE, BUN, CREATININE, CALCIUM,  in the last 72 hours  Studies/Results: Dg Lumbar Spine 2-3 Views  04/14/2012  *RADIOLOGY REPORT*  Clinical Data: L4-5 fusion.  DG C-ARM 1-60 MIN, LUMBAR SPINE - 2-3 VIEW  Technique: Two fluoroscopic intraoperative spot views of the lower lumbar spine are provided.  Comparison:  MRI lumbar spine 01/03/2012.  Findings: Provided images demonstrate pedicle screws at L4-5 with interbody spacer in place.  Trace anterolisthesis of L4-L5 is not visible on this examination. There is no fracture.  IMPRESSION: L4-5 fusion in progress.   Original Report Authenticated By: Holley Dexter, M.D.    Dg C-arm 1-60 Min  04/14/2012  *RADIOLOGY REPORT*  Clinical Data: L4-5 fusion.  DG C-ARM 1-60 MIN, LUMBAR SPINE - 2-3 VIEW  Technique: Two fluoroscopic intraoperative spot views of the lower lumbar spine are provided.  Comparison:  MRI lumbar spine 01/03/2012.  Findings: Provided images demonstrate pedicle screws at L4-5 with interbody spacer in place.  Trace anterolisthesis of L4-L5 is not visible on this examination. There is no fracture.  IMPRESSION: L4-5 fusion in progress.   Original Report Authenticated By: Holley Dexter, M.D.     Assessment/Plan: D/C drain.  D/C PCA. Oxy IR for pain.  Continue  PT.    LOS: 2 days    Dorian Heckle, MD 04/16/2012, 5:28 AM

## 2012-04-16 NOTE — Progress Notes (Signed)
Physical Therapy Treatment Patient Details Name: Rachel Castaneda MRN: [id number] DOB: 11-28-1945 Today's Date: 04/16/2012 Time: 0919-0950 PT Time Calculation (min): 31 min  PT Assessment / Plan / Recommendation Comments on Treatment Session  Pt making good progress with mobility + PT goals at this date.       Follow Up Recommendations  Home health PT;Supervision/Assistance - 24 hour     Does the patient have the potential to tolerate intense rehabilitation     Barriers to Discharge        Equipment Recommendations  Other (comment) (3-in-1)    Recommendations for Other Services    Frequency Min 6X/week   Plan Discharge plan remains appropriate    Precautions / Restrictions Precautions Precautions: Back Precaution Comments: Reviewed back precautions with pt Required Braces or Orthoses: Spinal Brace;Other Brace/Splint Other Brace/Splint: No back brace ordered in chart, but brace present in room.  used back brace in today's session.   Restrictions Weight Bearing Restrictions: No       Mobility  Bed Mobility Bed Mobility: Rolling Left;Left Sidelying to Sit;Sitting - Scoot to Edge of Bed;Sit to Sidelying Left Rolling Left: 4: Min assist;With rail Left Sidelying to Sit: 4: Min assist;HOB flat;With rails Sitting - Scoot to Edge of Bed: 5: Supervision Sit to Sidelying Left: 4: Min assist;HOB flat Details for Bed Mobility Assistance: Cues for sequencing & technique.  (A) to maintain back precautions when rolling to side, (A) to lift torso to sitting upright, & (A) to lift LE's back into bed.   Transfers Transfers: Sit to Stand;Stand to Sit Sit to Stand: 4: Min assist;With upper extremity assist;From bed;From chair/3-in-1 Stand to Sit: 4: Min guard;With upper extremity assist;With armrests;To chair/3-in-1;To bed Details for Transfer Assistance: cues for hand placement & technique.  (A) to achieve standing, balance, & safety.   Ambulation/Gait Ambulation/Gait Assistance: 4: Min  guard Ambulation Distance (Feet): 120 Feet Assistive device: Rolling walker Ambulation/Gait Assistance Details: Cues for increased step/stride length, tall posture, body positioning inside RW, & encouragement to decrease reliance of UE's on RW.   Gait Pattern: Step-through pattern;Decreased stride length;Decreased weight shift to right;Decreased weight shift to left (decreased step height) Gait velocity: decreased Stairs: No Wheelchair Mobility Wheelchair Mobility: No      PT Goals Acute Rehab PT Goals Time For Goal Achievement: 04/22/12 Potential to Achieve Goals: Good Pt will Roll Supine to Left Side: Independently PT Goal: Rolling Supine to Left Side - Progress: Progressing toward goal Pt will go Supine/Side to Sit: Independently PT Goal: Supine/Side to Sit - Progress: Progressing toward goal Pt will go Sit to Stand: Independently;with upper extremity assist PT Goal: Sit to Stand - Progress: Progressing toward goal Pt will Ambulate: >150 feet;with modified independence;with least restrictive assistive device PT Goal: Ambulate - Progress: Progressing toward goal Pt will Go Up / Down Stairs: 3-5 stairs;with modified independence;with least restrictive assistive device Pt will Perform Home Exercise Program: with supervision, verbal cues required/provided Additional Goals Additional Goal #1: Pt to follow all back precautions.   PT Goal: Additional Goal #1 - Progress: Progressing toward goal  Visit Information  Last PT Received On: 04/16/12 Assistance Needed: +1    Subjective Data      Cognition  Cognition Overall Cognitive Status: Appears within functional limits for tasks assessed/performed Arousal/Alertness: Awake/alert Orientation Level: Appears intact for tasks assessed Behavior During Session: [facility] for tasks performed    Balance     End of Session PT - End of Session Equipment Utilized During Treatment: Gait belt;Back  brace Activity Tolerance: Patient tolerated  treatment well Patient left: in bed;with call bell/phone within reach Nurse Communication: Mobility status     Verdell Face, [place] [phone] 04/16/2012

## 2012-04-17 LAB — GLUCOSE, CAPILLARY: Glucose-Capillary: 104 mg/dL — ABNORMAL HIGH (ref 70–99)

## 2012-04-17 MED ORDER — DIPHENHYDRAMINE-ZINC ACETATE 2-0.1 % EX CREA
TOPICAL_CREAM | Freq: Every day | CUTANEOUS | Status: DC | PRN
  Administered 2012-04-17: 20:00:00 via TOPICAL
  Filled 2012-04-17: qty 28

## 2012-04-17 NOTE — Progress Notes (Signed)
Occupational Therapy Treatment Patient Details Name: DEAIRA LECKEY MRN: [id number] DOB: 1945-05-17 Today's Date: 04/17/2012 Time: 4098-1191 OT Time Calculation (min): 57 min  OT Assessment / Plan / Recommendation Comments on Treatment Session Pt is educated in back precautions related to ADL and use of AE and DME.  Performing at a supervision level.  Will have assist of family and neighbor upon d/c at home.  Daughter to purchase tub seat with back and AE kit.  Needs 3 in1. Needs to practice tub transfer if still here tomorrow.    Follow Up Recommendations  No OT follow up;Supervision/Assistance - 24 hour    Barriers to Discharge       Equipment Recommendations  3 in 1 bedside comode    Recommendations for Other Services    Frequency Min 2X/week   Plan Discharge plan remains appropriate    Precautions / Restrictions Precautions Precautions: Back Precaution Comments: Pt generalizes back precautions in mobility and ADL. Required Braces or Orthoses: Spinal Brace;Other Brace/Splint Spinal Brace: Lumbar corset;Applied in sitting position   Pertinent Vitals/Pain Back pain, repositioned, RN notified for pain meds    ADL  Grooming: Wash/dry hands;Supervision/safety Where Assessed - Grooming: Unsupported standing Lower Body Bathing: Minimal assistance (instructed in use of long handled sponge and reacher) Where Assessed - Lower Body Bathing: Unsupported sitting;Supported sit to stand Upper Body Dressing: Set up (back brace) Where Assessed - Upper Body Dressing: Unsupported sitting Lower Body Dressing: Supervision/safety (instructed in use of AE for LB dressing) Where Assessed - Lower Body Dressing: Unsupported sitting;Supported sit to stand Toilet Transfer: Supervision/safety Toilet Transfer Method: Sit to Barista: Materials engineer and Hygiene: +1 Total assistance Where Assessed - Engineer, mining and Hygiene:  Sit to stand from 3-in-1 or toilet (pt is aware of toilet aide) Psychologist, educational:  (recommended tub seat with a back, daughter to purchase) Equipment Used: Gait belt;Back brace;Rolling walker Transfers/Ambulation Related to ADLs: supervision with RW    OT Diagnosis:    OT Problem List:   OT Treatment Interventions:     OT Goals ADL Goals Pt Will Perform Lower Body Bathing: with supervision;Sit to stand from chair;Sit to stand from bed;with adaptive equipment ADL Goal: Lower Body Bathing - Progress: Met Pt Will Perform Lower Body Dressing: with supervision;Sit to stand from chair;Sit to stand from bed;with adaptive equipment ADL Goal: Lower Body Dressing - Progress: Met Pt Will Transfer to Toilet: with supervision;Ambulation;with DME;Comfort height toilet;Maintaining back safety precautions ADL Goal: Toilet Transfer - Progress: Met Pt Will Perform Toileting - Clothing Manipulation: with supervision;Standing ADL Goal: Toileting - Clothing Manipulation - Progress: Partly met Pt Will Perform Toileting - Hygiene: with supervision;Sit to stand from 3-in-1/toilet;with adaptive equipment ADL Goal: Toileting - Hygiene - Progress: Partly met Pt Will Perform Tub/Shower Transfer: Tub transfer;with supervision;Ambulation;Shower seat with back;Maintaining back safety precautions ADL Goal: Web designer - Progress: Goal set today Miscellaneous OT Goals Miscellaneous OT Goal #1: Pt will perform bed mobility at supervision level as precursor for EOB ADL retraining. OT Goal: Miscellaneous Goal #1 - Progress: Met  Visit Information  Last OT Received On: 04/17/12 Assistance Needed: +1    Subjective Data      Prior Functioning       Cognition  Cognition Overall Cognitive Status: Appears within functional limits for tasks assessed/performed Arousal/Alertness: Awake/alert Orientation Level: Appears intact for tasks assessed Behavior During Session: [facility] for tasks performed     Mobility  Bed Mobility Bed Mobility: Rolling Left;Left  Sidelying to Sit;Sitting - Scoot to Edge of Bed Rolling Left: 5: Supervision Left Sidelying to Sit: 5: Supervision;HOB flat Sitting - Scoot to Edge of Bed: 5: Supervision Details for Bed Mobility Assistance: no cues, rail, HOB flat, required extra time only Transfers Transfers: Sit to Stand;Stand to Sit Sit to Stand: 5: Supervision;From elevated surface;From bed;From chair/3-in-1 Stand to Sit: 5: Supervision;With upper extremity assist;To chair/3-in-1 Details for Transfer Assistance: no cues needed    Exercises      Balance     End of Session OT - End of Session Activity Tolerance: Patient tolerated treatment well Patient left: in chair;with call bell/phone within reach;with family/visitor present Nurse Communication: Patient requests pain meds  GO     Evern Bio 04/17/2012, 9:50 AM [phone]

## 2012-04-17 NOTE — Progress Notes (Signed)
   CARE MANAGEMENT NOTE 04/17/2012  Patient:  Rachel Castaneda, Rachel Castaneda   Account Number:  [id number]  Date Initiated:  04/17/2012  Documentation initiated by:  [facility]  Subjective/Objective Assessment:   admitted postop L4-5 fusion     Action/Plan:   PT/OT evals- recommending HHPT and 3N1   Anticipated DC Date:  04/18/2012   Anticipated DC Plan:  HOME W HOME HEALTH SERVICES      DC Planning Services  CM consult      [facility] Choice  HOME HEALTH   Choice offered to / List presented to:  C-1 Patient   DME arranged  3-N-1      DME agency  [facility]     HH arranged  HH-2 PT      [facility] agency  [facility]   Status of service:  Completed, signed off Medicare Important Message given?   (If response is "NO", the following Medicare IM given date fields will be blank) Date Medicare IM given:   Date Additional Medicare IM given:    Discharge Disposition:  HOME W HOME HEALTH SERVICES  Per UR Regulation:  Reviewed for med. necessity/level of care/duration of stay  If discussed at Long Length of Stay Meetings, dates discussed:    Comments:  04/17/2012 1030 NCM spoke to pt and provided [facility] list. Pt requested [facility] for [facility]. She will need a 3n1 for home. A friend gave her a new RW. Dtr and ex husband will assist her at home post dc. Notified Genevieve Norlander of referral. Isidoro Donning RN CCM Case Mgmt phone [phone]   Laveyah Oriol- [phone] Kandie Keiper- [phone]

## 2012-04-17 NOTE — Progress Notes (Signed)
Patient ID: Rachel Castaneda, female   DOB: 07-06-1945, 67 y.o.   MRN: [id number] Better. incisionalp pain but no leg  Pain as preop. Wound dry. Dc in am  ?

## 2012-04-17 NOTE — Progress Notes (Signed)
Physical Therapy Treatment Patient Details Name: Rachel Castaneda MRN: [id number] DOB: 1945/06/22 Today's Date: 04/17/2012 Time: 1478-2956 PT Time Calculation (min): 16 min  PT Assessment / Plan / Recommendation Comments on Treatment Session  Pt progressing well towards all goals. Will complete stairs tomorrow AM prior to d/c home.    Follow Up Recommendations  Home health PT;Supervision/Assistance - 24 hour     Does the patient have the potential to tolerate intense rehabilitation     Barriers to Discharge        Equipment Recommendations   (3n1 commode)    Recommendations for Other Services    Frequency Min 5X/week   Plan Discharge plan remains appropriate;Frequency needs to be updated    Precautions / Restrictions Precautions Precautions: Back Precaution Comments: pt able to recall 3/3 back precautions Required Braces or Orthoses: Spinal Brace;Other Brace/Splint Spinal Brace: Lumbar corset;Applied in sitting position   Pertinent Vitals/Pain 8.5/10 surgical back pain    Mobility  Bed Mobility Bed Mobility: Rolling Left;Left Sidelying to Sit;Sitting - Scoot to Edge of Bed Rolling Left: 5: Supervision Left Sidelying to Sit: 5: Supervision;HOB flat Sitting - Scoot to Edge of Bed: 5: Supervision Details for Bed Mobility Assistance: no cues, rail, HOB flat, required extra time only Transfers Transfers: Sit to Stand;Stand to Sit Sit to Stand: 5: Supervision;From elevated surface;From bed;From chair/3-in-1 Stand to Sit: 5: Supervision;With upper extremity assist;To chair/3-in-1 Details for Transfer Assistance: use of grab bar to get up from [employer] Ambulation/Gait Ambulation/Gait Assistance: 4: Min guard Ambulation Distance (Feet): 150 Feet Assistive device: Rolling walker Ambulation/Gait Assistance Details: pt with good posture, smooth gait pattern Gait Pattern: Step-through pattern Gait velocity: decreased Stairs: No    Exercises     PT Diagnosis:    PT Problem  List:   PT Treatment Interventions:     PT Goals Acute Rehab PT Goals PT Goal: Rolling Supine to Left Side - Progress: Progressing toward goal PT Goal: Supine/Side to Sit - Progress: Progressing toward goal PT Goal: Sit to Stand - Progress: Progressing toward goal PT Goal: Ambulate - Progress: Progressing toward goal Additional Goals PT Goal: Additional Goal #1 - Progress: Met  Visit Information  Last PT Received On: 04/17/12 Assistance Needed: +1    Subjective Data  Subjective: Pt received in L sidelying with request to use restroom   Cognition  Cognition Overall Cognitive Status: Appears within functional limits for tasks assessed/performed Arousal/Alertness: Awake/alert Orientation Level: Appears intact for tasks assessed Behavior During Session: [facility] for tasks performed    Balance  Static Standing Balance Static Standing - Balance Support: No upper extremity supported Static Standing - Level of Assistance: 5: Stand by assistance Static Standing - Comment/# of Minutes: pt stood and washed hands at sink without LOB  End of Session PT - End of Session Equipment Utilized During Treatment: Gait belt;Back brace Activity Tolerance: Patient tolerated treatment well Patient left: in chair;with call bell/phone within reach Nurse Communication: Mobility status   GP     Marcene Brawn 04/17/2012, 5:14 PM   Lewis Shock, PT, DPT Pager #: [phone] Office #: [phone]

## 2012-04-18 MED FILL — Heparin Sodium (Porcine) Inj 1000 Unit/ML: INTRAMUSCULAR | Qty: 30 | Status: AC

## 2012-04-18 MED FILL — Sodium Chloride Irrigation Soln 0.9%: Qty: 3000 | Status: AC

## 2012-04-18 MED FILL — Sodium Chloride IV Soln 0.9%: INTRAVENOUS | Qty: 1000 | Status: AC

## 2012-04-18 NOTE — Progress Notes (Signed)
Occupational Therapy Treatment Patient Details Name: Rachel Castaneda MRN: [id number] DOB: [birth year] Today's Date: 04/18/2012 Time: 1000-1017 OT Time Calculation (min): 17 min  OT Assessment / Plan / Recommendation Comments on Treatment Session All education completed. Left message for case manager regarding need for 3 in 1 and tub bench for d/C. Pt has RW. will have 24/7 S and is ready for D/C.    Follow Up Recommendations  No OT follow up;Supervision/Assistance - 24 hour    Barriers to Discharge       Equipment Recommendations  3 in 1 bedside comode;Tub/shower bench    Recommendations for Other Services    Frequency Min 2X/week   Plan Discharge plan remains appropriate    Precautions / Restrictions Precautions Precautions: Back Precaution Booklet Issued: Yes (comment) Precaution Comments: pt able to recall 3/3 back precautions Required Braces or Orthoses: Spinal Brace;Other Brace/Splint Spinal Brace: Lumbar corset;Applied in sitting position   Pertinent Vitals/Pain no apparent distress     ADL  ADL Comments: Completed tub bench transfer with pt and daughter. also completed education regarding toileting and hygiene after toielting. Pt/daughter demonstrated and verbalized understanding with all tasks.    OT Diagnosis:    OT Problem List:   OT Treatment Interventions:     OT Goals Acute Rehab OT Goals OT Goal Formulation: With patient Time For Goal Achievement: 04/22/12 Potential to Achieve Goals: Good ADL Goals Pt Will Perform Lower Body Bathing: with supervision;Sit to stand from chair;Sit to stand from bed;with adaptive equipment ADL Goal: Lower Body Bathing - Progress: Met Pt Will Perform Lower Body Dressing: with supervision;Sit to stand from chair;Sit to stand from bed;with adaptive equipment ADL Goal: Lower Body Dressing - Progress: Met Pt Will Transfer to Toilet: with supervision;Ambulation;with DME;Comfort height toilet;Maintaining back safety precautions ADL  Goal: Toilet Transfer - Progress: Met Pt Will Perform Toileting - Clothing Manipulation: with supervision;Standing ADL Goal: Toileting - Clothing Manipulation - Progress: Met Pt Will Perform Toileting - Hygiene: with supervision;Sit to stand from 3-in-1/toilet;with adaptive equipment ADL Goal: Toileting - Hygiene - Progress: Met Pt Will Perform Tub/Shower Transfer: Tub transfer;with supervision;Ambulation;Shower seat with back;Maintaining back safety precautions ADL Goal: Web designer - Progress: Met Miscellaneous OT Goals Miscellaneous OT Goal #1: Pt will perform bed mobility at supervision level as precursor for EOB ADL retraining. OT Goal: Miscellaneous Goal #1 - Progress: Met  Visit Information  Last OT Received On: 04/18/12 Assistance Needed: +1    Subjective Data      Prior Functioning       Cognition       Mobility  Bed Mobility Bed Mobility: Rolling Left;Left Sidelying to Sit Rolling Left: 6: Modified independent (Device/Increase time) Transfers Transfers: Sit to Stand;Stand to Sit Sit to Stand: 6: Modified independent (Device/Increase time) Stand to Sit: 6: Modified independent (Device/Increase time)    Exercises      Balance     End of Session OT - End of Session Equipment Utilized During Treatment:  (RW. tub bnech) Activity Tolerance: Patient tolerated treatment well Patient left: in chair;with call bell/phone within reach;with family/visitor present Nurse Communication: Other (comment) (ready for D/C . need for DME)  GO     WARD,HILLARY 04/18/2012, 10:42 AM Luisa Dago, OTR/L  [phone] 04/18/2012

## 2012-04-18 NOTE — Progress Notes (Signed)
Physical Therapy Treatment Patient Details Name: Rachel Castaneda MRN: [id number] DOB: [birth year] Today's Date: 04/18/2012 Time: 4098-1191 PT Time Calculation (min): 15 min  PT Assessment / Plan / Recommendation Comments on Treatment Session  Discussed car transfers with pt and daughter. Pt mobilizing well and safe to d/c home when MD clears.    Follow Up Recommendations  Home health PT;Supervision/Assistance - 24 hour     Does the patient have the potential to tolerate intense rehabilitation     Barriers to Discharge        Equipment Recommendations   (3n1 commode)    Recommendations for Other Services    Frequency Min 5X/week   Plan Discharge plan remains appropriate;Frequency needs to be updated    Precautions / Restrictions Precautions Precautions: Back Precaution Booklet Issued: Yes (comment) Precaution Comments: pt able to recall 3/3 back precautions Required Braces or Orthoses: Spinal Brace;Other Brace/Splint Spinal Brace: Lumbar corset;Applied in sitting position Restrictions Weight Bearing Restrictions: No   Pertinent Vitals/Pain 6/10 back pain. heachache (pt reports "I"m getting a migraine."    Mobility  Bed Mobility Bed Mobility: Sit to Sidelying Left Rolling Left: 6: Modified independent (Device/Increase time) Sit to Sidelying Left: 6: Modified independent (Device/Increase time);HOB flat Details for Bed Mobility Assistance: no cues, rail, HOB flat, required extra time only Transfers Transfers: Sit to Stand;Stand to Sit Sit to Stand: 6: Modified independent (Device/Increase time) Stand to Sit: 6: Modified independent (Device/Increase time) Details for Transfer Assistance: increased time but safe technique Ambulation/Gait Ambulation/Gait Assistance: 5: Supervision Ambulation Distance (Feet): 100 Feet Assistive device: Rolling walker Ambulation/Gait Assistance Details: distance limited this date due to migraine Gait Pattern: Step-through pattern Gait  velocity: decreased Stairs: Yes Stairs Assistance: 4: Min guard Stair Management Technique: Sideways;One rail Right Number of Stairs: 3    Exercises     PT Diagnosis:    PT Problem List:   PT Treatment Interventions:     PT Goals Acute Rehab PT Goals PT Goal: Sit to Stand - Progress: Progressing toward goal PT Goal: Ambulate - Progress: Progressing toward goal PT Goal: Up/Down Stairs - Progress: Progressing toward goal Additional Goals PT Goal: Additional Goal #1 - Progress: Met  Visit Information  Last PT Received On: 04/18/12 Assistance Needed: +1    Subjective Data  Subjective: pt received sitting up in chair   Cognition  Cognition Overall Cognitive Status: Appears within functional limits for tasks assessed/performed Arousal/Alertness: Awake/alert Orientation Level: Appears intact for tasks assessed Behavior During Session: [facility] for tasks performed    Balance     End of Session PT - End of Session Equipment Utilized During Treatment: Gait belt;Back brace Activity Tolerance: Patient tolerated treatment well Patient left: in bed;with call bell/phone within reach;with family/visitor present Nurse Communication: Mobility status   GP     Marcene Brawn 04/18/2012, 10:54 AM  Lewis Shock, PT, DPT Pager #: [phone] Office #: [phone]

## 2012-04-18 NOTE — Progress Notes (Signed)
Occupational Therapy Treatment Patient Details Name: Rachel Castaneda MRN: [id number] DOB: 05/01/1945 Today's Date: 04/18/2012 Time: 4098-1191 OT Time Calculation (min): 31 min  OT Assessment / Plan / Recommendation Comments on Treatment Session Excellent progress. will plan to see for 2nd session with dauther to review tub bench transfers.    Follow Up Recommendations  No OT follow up;Supervision/Assistance - 24 hour    Barriers to Discharge       Equipment Recommendations  3 in 1 bedside comode;Tub/shower bench    Recommendations for Other Services    Frequency Min 2X/week   Plan Discharge plan remains appropriate    Precautions / Restrictions Precautions Precautions: Back Precaution Booklet Issued: Yes (comment) Precaution Comments: pt able to recall 3/3 back precautions Required Braces or Orthoses: Spinal Brace;Other Brace/Splint Spinal Brace: Lumbar corset;Applied in sitting position   Pertinent Vitals/Pain    no apparent distress  ADL  ADL Comments: Completed ADL session with pt and daughter using AE. Pt able to return demonstrate  back precautions during ADL. Daughter verbalized understanding. discussed home set up to increase safety and independence within the home. discussed use of walker bag and rearranging home to make appropriate for adhering to back precautions    OT Diagnosis:    OT Problem List:   OT Treatment Interventions:     OT Goals Acute Rehab OT Goals OT Goal Formulation: With patient Time For Goal Achievement: 04/22/12 Potential to Achieve Goals: Good ADL Goals Pt Will Perform Lower Body Bathing: with supervision;Sit to stand from chair;Sit to stand from bed;with adaptive equipment ADL Goal: Lower Body Bathing - Progress: Met Pt Will Perform Lower Body Dressing: with supervision;Sit to stand from chair;Sit to stand from bed;with adaptive equipment ADL Goal: Lower Body Dressing - Progress: Met Pt Will Transfer to Toilet: with  supervision;Ambulation;with DME;Comfort height toilet;Maintaining back safety precautions ADL Goal: Toilet Transfer - Progress: Met Pt Will Perform Toileting - Clothing Manipulation: with supervision;Standing ADL Goal: Toileting - Clothing Manipulation - Progress: Met Pt Will Perform Toileting - Hygiene: with supervision;Sit to stand from 3-in-1/toilet;with adaptive equipment ADL Goal: Toileting - Hygiene - Progress: Met Pt Will Perform Tub/Shower Transfer: Tub transfer;with supervision;Ambulation;Shower seat with back;Maintaining back safety precautions Miscellaneous OT Goals Miscellaneous OT Goal #1: Pt will perform bed mobility at supervision level as precursor for EOB ADL retraining. OT Goal: Miscellaneous Goal #1 - Progress: Met  Visit Information  Last OT Received On: 04/18/12 Assistance Needed: +1    Subjective Data      Prior Functioning       Cognition       Mobility  Bed Mobility Bed Mobility: Rolling Left;Left Sidelying to Sit Rolling Left: 6: Modified independent (Device/Increase time) Transfers Transfers: Sit to Stand;Stand to Sit Sit to Stand: 6: Modified independent (Device/Increase time) Stand to Sit: 6: Modified independent (Device/Increase time)    Exercises      Balance     End of Session OT - End of Session Equipment Utilized During Treatment: Other (comment) (AE. RW) Activity Tolerance: Patient tolerated treatment well Patient left: in chair;with call bell/phone within reach;with family/visitor present Nurse Communication: Other (comment) (ready for D/C . need for DME)  GO     Markise Haymer,HILLARY 04/18/2012, 10:38 AM Luisa Dago, OTR/L  [phone] 04/18/2012

## 2012-04-18 NOTE — Discharge Summary (Signed)
Physician Discharge Summary  Patient ID: Rachel Castaneda MRN: [id number] DOB/AGE: 09-Aug-1945 67 y.o.  Admit date: 04/14/2012 Discharge date: 04/18/2012  Admission Diagnoses:lumbar stenosis  Discharge Diagnoses: same   Discharged Condition: no pain Hospital Course:surgery  Consults: none  Significant Diagnostic Studies: mri  Treatments: lumbar fusion Discharge Exam: Blood pressure 110/74, pulse 100, temperature 98.4 F (36.9 C), temperature source Oral, resp. rate 16, height 5\' 3"  (1.6 m), weight 96.163 kg (212 lb), SpO2 99.00%. Wound dry, no weakness  Disposition: home     Medication List    ASK your doctor about these medications       ALPHA LIPOIC ACID PO  Take 100 mg by mouth daily.     aspirin 81 MG tablet  Take 81 mg by mouth daily.     Biotin 1000 MCG tablet  Take 1,000 mcg by mouth daily.     calcium carbonate 750 MG chewable tablet  Commonly known as:  TUMS EX  Chew 2 tablets by mouth 2 (two) times daily.     Calcium Carbonate-Vitamin D 300-100 MG-UNIT Caps  Take 400 Units by mouth.     cholecalciferol 1000 UNITS tablet  Commonly known as:  VITAMIN D  Take 1,000 Units by mouth daily.     doxycycline 100 MG tablet  Commonly known as:  VIBRA-TABS  Take 100 mg by mouth daily.     ferrous sulfate 325 (65 FE) MG EC tablet  Take 65 mg by mouth daily with breakfast.     lubiprostone 24 MCG capsule  Commonly known as:  AMITIZA  Take 24 mcg by mouth daily with breakfast.     metroNIDAZOLE 0.75 % gel  Commonly known as:  METROGEL  Apply 1 application topically daily.     multivitamin tablet  Take 1 tablet by mouth daily.     oxyCODONE-acetaminophen 7.5-325 MG per tablet  Commonly known as:  PERCOCET  Take 1 tablet by mouth every 4 (four) hours as needed for pain.     polyethylene glycol packet  Commonly known as:  MIRALAX / GLYCOLAX  Take 17 g by mouth daily after supper.     Potassium 99 MG Tabs  Take 99 mg by mouth daily.     VITAMIN B 12  PO  Take 2,500 mcg by mouth once a week.         Signed: Karn Cassis 04/18/2012, 11:40 AM

## 2012-09-13 ENCOUNTER — Other Ambulatory Visit: Payer: Self-pay

## 2012-12-14 ENCOUNTER — Other Ambulatory Visit: Payer: Self-pay

## 2015-07-02 ENCOUNTER — Other Ambulatory Visit: Payer: Self-pay | Admitting: General Surgery

## 2015-07-02 DIAGNOSIS — D0511 Intraductal carcinoma in situ of right breast: Secondary | ICD-10-CM

## 2015-07-02 DIAGNOSIS — C50912 Malignant neoplasm of unspecified site of left female breast: Secondary | ICD-10-CM

## 2015-07-08 ENCOUNTER — Encounter (HOSPITAL_BASED_OUTPATIENT_CLINIC_OR_DEPARTMENT_OTHER): Payer: Self-pay | Admitting: *Deleted

## 2015-07-09 ENCOUNTER — Encounter: Payer: Self-pay | Admitting: Genetic Counselor

## 2015-07-10 ENCOUNTER — Other Ambulatory Visit: Payer: Self-pay

## 2015-07-10 ENCOUNTER — Encounter (HOSPITAL_BASED_OUTPATIENT_CLINIC_OR_DEPARTMENT_OTHER)
Admission: RE | Admit: 2015-07-10 | Discharge: 2015-07-10 | Disposition: A | Discharge status: Home / Self Care | Payer: Medicare Other | Source: Ambulatory Visit | Attending: General Surgery | Admitting: General Surgery

## 2015-07-10 DIAGNOSIS — C50912 Malignant neoplasm of unspecified site of left female breast: Secondary | ICD-10-CM | POA: Diagnosis not present

## 2015-07-10 DIAGNOSIS — Z170 Estrogen receptor positive status [ER+]: Secondary | ICD-10-CM | POA: Diagnosis not present

## 2015-07-10 DIAGNOSIS — Z029 Encounter for administrative examinations, unspecified: Secondary | ICD-10-CM | POA: Diagnosis present

## 2015-07-10 DIAGNOSIS — G4730 Sleep apnea, unspecified: Secondary | ICD-10-CM | POA: Diagnosis not present

## 2015-07-10 DIAGNOSIS — E1151 Type 2 diabetes mellitus with diabetic peripheral angiopathy without gangrene: Secondary | ICD-10-CM | POA: Diagnosis not present

## 2015-07-10 DIAGNOSIS — I10 Essential (primary) hypertension: Secondary | ICD-10-CM | POA: Diagnosis not present

## 2015-07-10 DIAGNOSIS — D0511 Intraductal carcinoma in situ of right breast: Secondary | ICD-10-CM | POA: Diagnosis not present

## 2015-07-10 LAB — CBC WITH DIFFERENTIAL/PLATELET
Basophils Absolute: 0 10*3/uL (ref 0.0–0.1)
Basophils Relative: 0 %
Eosinophils Absolute: 0.1 10*3/uL (ref 0.0–0.7)
Eosinophils Relative: 1 %
HCT: 44.1 % (ref 36.0–46.0)
Hemoglobin: 14.4 g/dL (ref 12.0–15.0)
Lymphocytes Relative: 28 %
Lymphs Abs: 1.8 10*3/uL (ref 0.7–4.0)
MCH: 31.3 pg (ref 26.0–34.0)
MCHC: 32.7 g/dL (ref 30.0–36.0)
MCV: 95.9 fL (ref 78.0–100.0)
Monocytes Absolute: 0.5 10*3/uL (ref 0.1–1.0)
Monocytes Relative: 8 %
Neutro Abs: 4.1 10*3/uL (ref 1.7–7.7)
Neutrophils Relative %: 63 %
Platelets: 217 10*3/uL (ref 150–400)
RBC: 4.6 MIL/uL (ref 3.87–5.11)
RDW: 13 % (ref 11.5–15.5)
WBC: 6.4 10*3/uL (ref 4.0–10.5)

## 2015-07-10 LAB — COMPREHENSIVE METABOLIC PANEL
ALT: 16 U/L (ref 14–54)
AST: 21 U/L (ref 15–41)
Albumin: 3.5 g/dL (ref 3.5–5.0)
Alkaline Phosphatase: 86 U/L (ref 38–126)
Anion gap: 8 (ref 5–15)
BUN: 11 mg/dL (ref 6–20)
CO2: 29 mmol/L (ref 22–32)
Calcium: 9.1 mg/dL (ref 8.9–10.3)
Chloride: 105 mmol/L (ref 101–111)
Creatinine, Ser: 0.6 mg/dL (ref 0.44–1.00)
GFR calc Af Amer: >60 mL/min (ref >60)
GFR calc non Af Amer: >60 mL/min (ref >60)
Glucose, Bld: 100 mg/dL — ABNORMAL HIGH (ref 65–99)
Potassium: 3.5 mmol/L (ref 3.5–5.1)
Sodium: 142 mmol/L (ref 135–145)
Total Bilirubin: 0.6 mg/dL (ref 0.3–1.2)
Total Protein: 6.3 g/dL — ABNORMAL LOW (ref 6.5–8.1)

## 2015-07-10 NOTE — Progress Notes (Signed)
Given 8 oz carton of boost freezer instructed to drink by 4 am morning of surgery. Instructed to only drink this no other liquid after midnight. Written instructions given also for same. Dr Marcie Bal reviewed ekg and cleared

## 2015-07-14 ENCOUNTER — Encounter (HOSPITAL_BASED_OUTPATIENT_CLINIC_OR_DEPARTMENT_OTHER): Payer: Self-pay

## 2015-07-14 ENCOUNTER — Ambulatory Visit (HOSPITAL_BASED_OUTPATIENT_CLINIC_OR_DEPARTMENT_OTHER): Payer: Medicare Other | Admitting: Certified Registered"

## 2015-07-14 ENCOUNTER — Encounter (HOSPITAL_BASED_OUTPATIENT_CLINIC_OR_DEPARTMENT_OTHER)
Admission: RE | Disposition: A | Discharge status: Home / Self Care | Payer: Self-pay | Source: Ambulatory Visit | Attending: General Surgery

## 2015-07-14 ENCOUNTER — Ambulatory Visit (HOSPITAL_COMMUNITY)
Admission: RE | Admit: 2015-07-14 | Discharge: 2015-07-14 | Disposition: A | Discharge status: Home / Self Care | Payer: Medicare Other | Source: Ambulatory Visit | Attending: General Surgery | Admitting: General Surgery

## 2015-07-14 ENCOUNTER — Ambulatory Visit (HOSPITAL_BASED_OUTPATIENT_CLINIC_OR_DEPARTMENT_OTHER)
Admission: RE | Admit: 2015-07-14 | Discharge: 2015-07-15 | Disposition: A | Discharge status: Home / Self Care | Payer: Medicare Other | Source: Ambulatory Visit | Attending: General Surgery | Admitting: General Surgery

## 2015-07-14 DIAGNOSIS — Z170 Estrogen receptor positive status [ER+]: Secondary | ICD-10-CM | POA: Insufficient documentation

## 2015-07-14 DIAGNOSIS — I10 Essential (primary) hypertension: Secondary | ICD-10-CM | POA: Insufficient documentation

## 2015-07-14 DIAGNOSIS — G4730 Sleep apnea, unspecified: Secondary | ICD-10-CM | POA: Insufficient documentation

## 2015-07-14 DIAGNOSIS — C50911 Malignant neoplasm of unspecified site of right female breast: Secondary | ICD-10-CM | POA: Diagnosis present

## 2015-07-14 DIAGNOSIS — D0511 Intraductal carcinoma in situ of right breast: Secondary | ICD-10-CM

## 2015-07-14 DIAGNOSIS — C50912 Malignant neoplasm of unspecified site of left female breast: Secondary | ICD-10-CM | POA: Diagnosis not present

## 2015-07-14 DIAGNOSIS — E1151 Type 2 diabetes mellitus with diabetic peripheral angiopathy without gangrene: Secondary | ICD-10-CM | POA: Insufficient documentation

## 2015-07-14 HISTORY — PX: MASTECTOMY W/ SENTINEL NODE BIOPSY: SHX2001

## 2015-07-14 HISTORY — DX: Essential (primary) hypertension: I10

## 2015-07-14 HISTORY — DX: Cardiac arrhythmia, unspecified: I49.9

## 2015-07-14 LAB — CBC
HCT: 32.4 % — ABNORMAL LOW (ref 36.0–46.0)
Hemoglobin: 10.5 g/dL — ABNORMAL LOW (ref 12.0–15.0)
MCH: 30.5 pg (ref 26.0–34.0)
MCHC: 32.4 g/dL (ref 30.0–36.0)
MCV: 94.2 fL (ref 78.0–100.0)
Platelets: 178 10*3/uL (ref 150–400)
RBC: 3.44 MIL/uL — ABNORMAL LOW (ref 3.87–5.11)
RDW: 12.7 % (ref 11.5–15.5)
WBC: 15.4 10*3/uL — ABNORMAL HIGH (ref 4.0–10.5)

## 2015-07-14 LAB — GLUCOSE, CAPILLARY: Glucose-Capillary: 103 mg/dL — ABNORMAL HIGH (ref 65–99)

## 2015-07-14 SURGERY — MASTECTOMY WITH SENTINEL LYMPH NODE BIOPSY
Anesthesia: General | Site: Breast | Laterality: Bilateral

## 2015-07-14 MED ORDER — DEXAMETHASONE SODIUM PHOSPHATE 4 MG/ML IJ SOLN
INTRAMUSCULAR | Status: DC | PRN
  Administered 2015-07-14: 4 mg via INTRAVENOUS

## 2015-07-14 MED ORDER — LIDOCAINE HCL (CARDIAC) 20 MG/ML IV SOLN
INTRAVENOUS | Status: DC | PRN
  Administered 2015-07-14: 30 mg via INTRAVENOUS

## 2015-07-14 MED ORDER — ONDANSETRON HCL 4 MG/2ML IJ SOLN
INTRAMUSCULAR | Status: AC
  Filled 2015-07-14: qty 2

## 2015-07-14 MED ORDER — FENTANYL CITRATE (PF) 100 MCG/2ML IJ SOLN
INTRAMUSCULAR | Status: AC
  Filled 2015-07-14: qty 2

## 2015-07-14 MED ORDER — GLYCOPYRROLATE 0.2 MG/ML IJ SOLN: 0.2000 mg | Freq: Once | INTRAMUSCULAR | Status: DC | PRN

## 2015-07-14 MED ORDER — LIDOCAINE 2% (20 MG/ML) 5 ML SYRINGE
INTRAMUSCULAR | Status: AC
  Filled 2015-07-14: qty 5

## 2015-07-14 MED ORDER — ACETAMINOPHEN 500 MG PO TABS
1000.0000 mg | ORAL_TABLET | Freq: Four times a day (QID) | ORAL | Status: DC
  Administered 2015-07-14 – 2015-07-15 (×3): 1000 mg via ORAL
  Filled 2015-07-14 (×3): qty 2

## 2015-07-14 MED ORDER — FENTANYL CITRATE (PF) 100 MCG/2ML IJ SOLN
25.0000 ug | INTRAMUSCULAR | Status: DC | PRN
  Administered 2015-07-14 (×2): 50 ug via INTRAVENOUS

## 2015-07-14 MED ORDER — MORPHINE SULFATE (PF) 2 MG/ML IV SOLN: 1.0000 mg | INTRAVENOUS | Status: DC | PRN

## 2015-07-14 MED ORDER — 0.9 % SODIUM CHLORIDE (POUR BTL) OPTIME
TOPICAL | Status: DC | PRN
  Administered 2015-07-14: 600 mL

## 2015-07-14 MED ORDER — SODIUM CHLORIDE 0.9 % IV SOLN: INTRAVENOUS | Status: DC

## 2015-07-14 MED ORDER — MIDAZOLAM HCL 2 MG/2ML IJ SOLN
INTRAMUSCULAR | Status: AC
  Filled 2015-07-14: qty 2

## 2015-07-14 MED ORDER — SODIUM CHLORIDE 0.9 % IJ SOLN
INTRAMUSCULAR | Status: AC
  Filled 2015-07-14: qty 20

## 2015-07-14 MED ORDER — LACTATED RINGERS IV SOLN
INTRAVENOUS | Status: DC
  Administered 2015-07-14 (×2): via INTRAVENOUS

## 2015-07-14 MED ORDER — SCOPOLAMINE 1 MG/3DAYS TD PT72: 1.0000 | MEDICATED_PATCH | Freq: Once | TRANSDERMAL | Status: DC | PRN

## 2015-07-14 MED ORDER — PROPOFOL 10 MG/ML IV BOLUS
INTRAVENOUS | Status: DC | PRN
  Administered 2015-07-14: 150 mg via INTRAVENOUS

## 2015-07-14 MED ORDER — MIDAZOLAM HCL 2 MG/2ML IJ SOLN
1.0000 mg | INTRAMUSCULAR | Status: DC | PRN
  Administered 2015-07-14: 1.5 mg via INTRAVENOUS
  Administered 2015-07-14: 0.5 mg via INTRAVENOUS

## 2015-07-14 MED ORDER — ACETAMINOPHEN 10 MG/ML IV SOLN
INTRAVENOUS | Status: DC | PRN
  Administered 2015-07-14: 1000 mg via INTRAVENOUS

## 2015-07-14 MED ORDER — TECHNETIUM TC 99M SULFUR COLLOID FILTERED
2.0000 | Freq: Once | INTRAVENOUS | Status: AC | PRN
  Administered 2015-07-14: 2 via INTRADERMAL

## 2015-07-14 MED ORDER — METHYLENE BLUE 0.5 % INJ SOLN
INTRAVENOUS | Status: AC
  Filled 2015-07-14: qty 20

## 2015-07-14 MED ORDER — EPHEDRINE SULFATE 50 MG/ML IJ SOLN
INTRAMUSCULAR | Status: DC | PRN
  Administered 2015-07-14 (×2): 10 mg via INTRAVENOUS

## 2015-07-14 MED ORDER — METHOCARBAMOL 500 MG PO TABS: 500.0000 mg | ORAL_TABLET | Freq: Four times a day (QID) | ORAL | Status: DC | PRN

## 2015-07-14 MED ORDER — FENTANYL CITRATE (PF) 100 MCG/2ML IJ SOLN
50.0000 ug | INTRAMUSCULAR | Status: AC | PRN
  Administered 2015-07-14: 25 ug via INTRAVENOUS
  Administered 2015-07-14 (×6): 50 ug via INTRAVENOUS
  Administered 2015-07-14: 75 ug via INTRAVENOUS

## 2015-07-14 MED ORDER — PROMETHAZINE HCL 25 MG/ML IJ SOLN: 6.2500 mg | INTRAMUSCULAR | Status: DC | PRN

## 2015-07-14 MED ORDER — CEFAZOLIN SODIUM-DEXTROSE 2-4 GM/100ML-% IV SOLN
INTRAVENOUS | Status: AC
  Filled 2015-07-14: qty 100

## 2015-07-14 MED ORDER — CEFAZOLIN SODIUM-DEXTROSE 2-4 GM/100ML-% IV SOLN
2.0000 g | INTRAVENOUS | Status: AC
  Administered 2015-07-14: 2 g via INTRAVENOUS

## 2015-07-14 MED ORDER — ONDANSETRON HCL 4 MG/2ML IJ SOLN
INTRAMUSCULAR | Status: DC | PRN
  Administered 2015-07-14: 4 mg via INTRAVENOUS

## 2015-07-14 MED ORDER — ONDANSETRON HCL 4 MG/2ML IJ SOLN: 4.0000 mg | Freq: Four times a day (QID) | INTRAMUSCULAR | Status: DC | PRN

## 2015-07-14 MED ORDER — ACETAMINOPHEN 10 MG/ML IV SOLN
INTRAVENOUS | Status: AC
  Filled 2015-07-14: qty 100

## 2015-07-14 MED ORDER — OXYCODONE HCL 5 MG PO TABS
5.0000 mg | ORAL_TABLET | ORAL | Status: DC | PRN
  Administered 2015-07-14: 10 mg via ORAL
  Filled 2015-07-14: qty 2

## 2015-07-14 MED ORDER — BUPIVACAINE-EPINEPHRINE (PF) 0.5% -1:200000 IJ SOLN
INTRAMUSCULAR | Status: AC
  Filled 2015-07-14: qty 30

## 2015-07-14 MED ORDER — ONDANSETRON 4 MG PO TBDP: 4.0000 mg | ORAL_TABLET | Freq: Four times a day (QID) | ORAL | Status: DC | PRN

## 2015-07-14 MED ORDER — BUPIVACAINE HCL (PF) 0.25 % IJ SOLN
INTRAMUSCULAR | Status: AC
  Filled 2015-07-14: qty 90

## 2015-07-14 MED ORDER — PROPOFOL 500 MG/50ML IV EMUL
INTRAVENOUS | Status: AC
  Filled 2015-07-14: qty 50

## 2015-07-14 MED ORDER — DEXAMETHASONE SODIUM PHOSPHATE 10 MG/ML IJ SOLN
INTRAMUSCULAR | Status: AC
  Filled 2015-07-14: qty 1

## 2015-07-14 SURGICAL SUPPLY — 60 items
APPLIER CLIP 9.375 MED OPEN (MISCELLANEOUS) ×4
BINDER BREAST XXLRG (GAUZE/BANDAGES/DRESSINGS) ×4 IMPLANT
BIOPATCH RED 1 DISK 7.0 (GAUZE/BANDAGES/DRESSINGS) ×6 IMPLANT
BLADE HEX COATED 2.75 (ELECTRODE) ×2 IMPLANT
BLADE SURG 10 STRL SS (BLADE) ×4 IMPLANT
BLADE SURG 15 STRL LF DISP TIS (BLADE) ×2 IMPLANT
BLADE SURG 15 STRL SS (BLADE) ×2
CANISTER SUCT 1200ML W/VALVE (MISCELLANEOUS) ×2 IMPLANT
CHLORAPREP W/TINT 26ML (MISCELLANEOUS) ×4 IMPLANT
CLIP APPLIE 9.375 MED OPEN (MISCELLANEOUS) ×2 IMPLANT
COVER BACK TABLE 60X90IN (DRAPES) ×2 IMPLANT
COVER MAYO STAND STRL (DRAPES) ×2 IMPLANT
COVER PROBE W GEL 5X96 (DRAPES) ×2 IMPLANT
DRAIN CHANNEL 19F RND (DRAIN) ×4 IMPLANT
DRAPE LAPAROSCOPIC ABDOMINAL (DRAPES) IMPLANT
DRAPE U-SHAPE 76X120 STRL (DRAPES) ×4 IMPLANT
DRAPE UTILITY XL STRL (DRAPES) ×4 IMPLANT
DRSG PAD ABDOMINAL 8X10 ST (GAUZE/BANDAGES/DRESSINGS) ×4 IMPLANT
DRSG TEGADERM 4X4.75 (GAUZE/BANDAGES/DRESSINGS) ×4 IMPLANT
ELECT BLADE 4.0 EZ CLEAN MEGAD (MISCELLANEOUS) ×4
ELECT REM PT RETURN 9FT ADLT (ELECTROSURGICAL) ×2
ELECTRODE BLDE 4.0 EZ CLN MEGD (MISCELLANEOUS) ×2 IMPLANT
ELECTRODE REM PT RTRN 9FT ADLT (ELECTROSURGICAL) ×1 IMPLANT
EVACUATOR SILICONE 100CC (DRAIN) ×4 IMPLANT
GLOVE BIO SURGEON STRL SZ 6 (GLOVE) ×2 IMPLANT
GLOVE BIO SURGEON STRL SZ7 (GLOVE) ×2 IMPLANT
GLOVE BIOGEL PI IND STRL 6.5 (GLOVE) ×1 IMPLANT
GLOVE BIOGEL PI IND STRL 7.0 (GLOVE) ×2 IMPLANT
GLOVE BIOGEL PI IND STRL 7.5 (GLOVE) ×1 IMPLANT
GLOVE BIOGEL PI IND STRL 8 (GLOVE) ×2 IMPLANT
GLOVE BIOGEL PI INDICATOR 6.5 (GLOVE) ×1
GLOVE BIOGEL PI INDICATOR 7.0 (GLOVE) ×2
GLOVE BIOGEL PI INDICATOR 7.5 (GLOVE) ×1
GLOVE BIOGEL PI INDICATOR 8 (GLOVE) ×2
GLOVE ECLIPSE 6.5 STRL STRAW (GLOVE) ×2 IMPLANT
GLOVE SURG SS PI 7.5 STRL IVOR (GLOVE) ×4 IMPLANT
GOWN STRL REUS W/ TWL LRG LVL3 (GOWN DISPOSABLE) ×3 IMPLANT
GOWN STRL REUS W/ TWL XL LVL3 (GOWN DISPOSABLE) ×2 IMPLANT
GOWN STRL REUS W/TWL LRG LVL3 (GOWN DISPOSABLE) ×3
GOWN STRL REUS W/TWL XL LVL3 (GOWN DISPOSABLE) ×2
HEMOSTAT ARISTA ABSORB 3G PWDR (MISCELLANEOUS) ×4 IMPLANT
LIQUID BAND (GAUZE/BANDAGES/DRESSINGS) ×8 IMPLANT
NEEDLE HYPO 25X1 1.5 SAFETY (NEEDLE) ×2 IMPLANT
NS IRRIG 1000ML POUR BTL (IV SOLUTION) ×2 IMPLANT
PACK BASIN DAY SURGERY FS (CUSTOM PROCEDURE TRAY) ×2 IMPLANT
PEN SKIN MARKING BROAD TIP (MISCELLANEOUS) ×2 IMPLANT
PENCIL BUTTON HOLSTER BLD 10FT (ELECTRODE) ×4 IMPLANT
PIN SAFETY STERILE (MISCELLANEOUS) ×4 IMPLANT
SLEEVE SCD COMPRESS KNEE MED (MISCELLANEOUS) ×2 IMPLANT
SPONGE LAP 18X18 X RAY DECT (DISPOSABLE) ×4 IMPLANT
STRIP CLOSURE SKIN 1/2X4 (GAUZE/BANDAGES/DRESSINGS) ×6 IMPLANT
SUT ETHILON 2 0 FS 18 (SUTURE) ×4 IMPLANT
SUT MNCRL AB 4-0 PS2 18 (SUTURE) ×8 IMPLANT
SUT SILK 2 0 SH (SUTURE) ×4 IMPLANT
SUT VICRYL 3-0 CR8 SH (SUTURE) ×6 IMPLANT
SYR CONTROL 10ML LL (SYRINGE) ×2 IMPLANT
TOWEL OR 17X24 6PK STRL BLUE (TOWEL DISPOSABLE) ×4 IMPLANT
TOWEL OR NON WOVEN STRL DISP B (DISPOSABLE) ×2 IMPLANT
TUBE CONNECTING 20X1/4 (TUBING) ×2 IMPLANT
YANKAUER SUCT BULB TIP NO VENT (SUCTIONS) ×2 IMPLANT

## 2015-07-14 NOTE — Progress Notes (Signed)
Assisted Dr. Oletta Lamas with right, left, ultrasound guided, pectoralis block and nuc med with bil nuc med inj. Side rails up, monitors on throughout procedure. See vital signs in flow sheet. Tolerated Procedure well.

## 2015-07-14 NOTE — Anesthesia Procedure Notes (Addendum)
Procedure Name: LMA Insertion Date/Time: 07/14/2015 7:52 AM Performed by: BLOCKER, TIMOTHY D Pre-anesthesia Checklist: Patient identified, Emergency Drugs available, Suction available and Patient being monitored Patient Re-evaluated:Patient Re-evaluated prior to inductionOxygen Delivery Method: Circle System Utilized Preoxygenation: Pre-oxygenation with 100% oxygen Intubation Type: IV induction Ventilation: Mask ventilation without difficulty LMA: LMA inserted LMA Size: 4.0 Number of attempts: 1 Airway Equipment and Method: bite block Placement Confirmation: positive ETCO2 Tube secured with: Tape Dental Injury: Teeth and Oropharynx as per pre-operative assessment    Anesthesia Regional Block:  Pectoralis block  Pre-Anesthetic Checklist: ,, timeout performed, Correct Patient, Correct Site, Correct Laterality, Correct Procedure, Correct Position, site marked, Risks and benefits discussed,  Surgical consent,  Pre-op evaluation,  At surgeon's request and post-op pain management  Laterality: Left and Right  Prep: chloraprep       Needles:   Needle Type: Echogenic Stimulator Needle          Additional Needles:  Procedures: Doppler guided and ultrasound guided (picture in chart) Pectoralis block Narrative:  Start time: 07/14/2015 7:05 AM End time: 07/14/2015 7:20 AM Injection made incrementally with aspirations every 5 mL.  Performed by: Personally  Anesthesiologist: Finis Bud

## 2015-07-14 NOTE — Anesthesia Preprocedure Evaluation (Addendum)
Anesthesia Evaluation  Patient identified by MRN, date of birth, ID band Patient awake    Reviewed: Allergy & Precautions, NPO status , Patient's Chart, lab work & pertinent test results  Airway Mallampati: II  TM Distance: >3 FB Neck ROM: Full    Dental   Pulmonary sleep apnea and Continuous Positive Airway Pressure Ventilation ,    breath sounds clear to auscultation       Cardiovascular hypertension, Pt. on medications + Peripheral Vascular Disease   Rhythm:Regular Rate:Normal     Neuro/Psych  Headaches, PSYCHIATRIC DISORDERS Anxiety    GI/Hepatic negative GI ROS, Neg liver ROS,   Endo/Other  diabetes  Renal/GU negative Renal ROS     Musculoskeletal  (+) Arthritis ,   Abdominal   Peds  Hematology  (+) anemia ,   Anesthesia Other Findings   Reproductive/Obstetrics                            Anesthesia Physical Anesthesia Plan  ASA: III  Anesthesia Plan: General   Post-op Pain Management:    Induction: Intravenous  Airway Management Planned: Oral ETT  Additional Equipment:   Intra-op Plan:   Post-operative Plan: Extubation in OR  Informed Consent: I have reviewed the patients History and Physical, chart, labs and discussed the procedure including the risks, benefits and alternatives for the proposed anesthesia with the patient or authorized representative who has indicated his/her understanding and acceptance.   Dental advisory given  Plan Discussed with: CRNA and Anesthesiologist  Anesthesia Plan Comments:         Anesthesia Quick Evaluation

## 2015-07-14 NOTE — Transfer of Care (Signed)
Immediate Anesthesia Transfer of Care Note  Patient: Rachel Castaneda  Procedure(s) Performed: Procedure(s): BILATERAL TOTAL MASTECTOMIES WITH LEFT AXILLARY SENTINEL LYMPH NODE BIOPSY (Bilateral)  Patient Location: PACU  Anesthesia Type:GA combined with regional for post-op pain  Level of Consciousness: awake, alert , oriented and patient cooperative  Airway & Oxygen Therapy: Patient Spontanous Breathing and Patient connected to face mask oxygen  Post-op Assessment: Report given to RN and Post -op Vital signs reviewed and stable  Post vital signs: Reviewed and stable  Last Vitals:  Filed Vitals:   07/14/15 0742 07/14/15 0743  BP:    Pulse: 82 83  Temp:    Resp: 24 15    Last Pain:  Filed Vitals:   07/14/15 0744  PainSc: 3       Patients Stated Pain Goal: 3 ([id number])  Complications: No apparent anesthesia complications

## 2015-07-14 NOTE — Anesthesia Postprocedure Evaluation (Signed)
Anesthesia Post Note  Patient: Rachel Castaneda  Procedure(s) Performed: Procedure(s) (LRB): BILATERAL TOTAL MASTECTOMIES WITH LEFT AXILLARY SENTINEL LYMPH NODE BIOPSY (Bilateral)  Patient location during evaluation: PACU Anesthesia Type: General and Regional Level of consciousness: awake and alert Pain management: pain level controlled Vital Signs Assessment: post-procedure vital signs reviewed and stable Respiratory status: spontaneous breathing, nonlabored ventilation, respiratory function stable and patient connected to nasal cannula oxygen Cardiovascular status: blood pressure returned to baseline and stable Postop Assessment: no signs of nausea or vomiting Anesthetic complications: no    Last Vitals:  Filed Vitals:   07/14/15 1330 07/14/15 1415  BP:    Pulse: 83 80  Temp:    Resp:      Last Pain:  Filed Vitals:   07/14/15 1429  PainSc: 1                  Montez Hageman

## 2015-07-14 NOTE — H&P (Signed)
70 yof who has no family history, no prior breast history who presents after undergoing screening mm ( no mass, discharge or any complaints). her density is b. 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 on the left side there is a 1.4 cm mass. on [place] there is a 9x9x8 mm mass and the left axilla is negative. 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 she comes in today with her husband and one of her daughters. she has two daughters. she has history of multiple surgeries, she still has her ovaries. she has issues with opiates after back surgery.   Other Problems Lars Mage Spillers, CMA; 07/02/2015 2:10 PM) Arthritis Atrial Fibrillation Back Pain Breast Cancer Gastroesophageal Reflux Disease High blood pressure Lump In Breast Migraine Headache Sleep Apnea Umbilical Hernia Repair Ventral Hernia Repair  Past Surgical History (Alisha Spillers, CMA; 07/02/2015 2:10 PM) Breast Biopsy Bilateral. Cesarean Section - Multiple Gallbladder Surgery - Open Gastric Bypass Hysterectomy (not due to cancer) - Partial Resection of Small Bowel Resection of Stomach Spinal Surgery - Lower Back Tonsillectomy Ventral / Umbilical Hernia Surgery Bilateral.  Diagnostic Studies History Lars Mage Spillers, CMA; 07/02/2015 2:10 PM) Colonoscopy 5-10 years ago Mammogram within last year  Allergies Illene Regulus, CMA; 07/02/2015 2:15 PM) Codeine Sulfate *ANALGESICS - OPIOID* NSAIDs  Medication History (Alisha Spillers, CMA; 07/02/2015 2:31 PM) MetroNIDAZOLE (0.75% Gel, External) Active. Nystatin (100000 UNIT/GM Powder, External) Active. Baby Aspirin (81MG Tablet Chewable, Oral) Active. Multivitamin Women 50+ (Oral) Active. Alpha Lipoic Acid (200MG Capsule, Oral) Active. Probiotic Acidophilus (Oral) Active. Iron (65MG  Tablet, Oral) Active. Potassium (99MG Tablet, Oral) Active. Biotin (1000MCG Tablet, Oral) Active. B12 Liquid Health Booster (1000MCG/15ML Liquid, Oral) Active. Medications Reconciled  Social History Illene Regulus, CMA; 07/02/2015 2:10 PM) Alcohol use Occasional alcohol use. Caffeine use Coffee, Tea. No drug use Tobacco use Never smoker.  Family History Illene Regulus, [place]; 07/02/2015 2:10 PM) Anesthetic complications Daughter. Arthritis Mother. Cancer Daughter, Son. Cerebrovascular Accident Mother. Hypertension Mother. Kidney Disease Father. Migraine Headache Daughter.  Pregnancy / Birth History Illene Regulus, CMA; 07/02/2015 2:10 PM) Age at menarche 6 years. Gravida 3 Maternal age 26-25 Para 3    Review of Systems Lars Mage Spillers CMA; 07/02/2015 2:10 PM) General Not Present- Appetite Loss, Chills, Fatigue, Fever, Night Sweats, Weight Gain and Weight Loss. Skin Present- Rash. Not Present- Change in Wart/Mole, Dryness, Hives, Jaundice, New Lesions, Non-Healing Wounds and Ulcer. HEENT Present- Hearing Loss and Ringing in the Ears. Not Present- Earache, Hoarseness, Nose Bleed, Oral Ulcers, Seasonal Allergies, Sinus Pain, Sore Throat, Visual Disturbances, Wears glasses/contact lenses and Yellow Eyes. Respiratory Present- Snoring. Not Present- Bloody sputum, Chronic Cough, Difficulty Breathing and Wheezing. Breast Present- Breast Mass. Not Present- Breast Pain, Nipple Discharge and Skin Changes. Cardiovascular Present- Palpitations. Not Present- Chest Pain, Difficulty Breathing Lying Down, Leg Cramps, Rapid Heart Rate, Shortness of Breath and Swelling of Extremities. Female Genitourinary Not Present- Frequency, Nocturia, Painful Urination, Pelvic Pain and Urgency. Musculoskeletal Present- Back Pain. Not Present- Joint Pain, Joint Stiffness, Muscle Pain, Muscle Weakness and Swelling of Extremities. Neurological Not Present- Decreased Memory, Fainting,  Headaches, Numbness, Seizures, Tingling, Tremor, Trouble walking and Weakness. Endocrine Present- Hair Changes. Not Present- Cold Intolerance, Excessive Hunger, Heat Intolerance, Hot flashes and New Diabetes. Hematology Not Present- Easy Bruising, Excessive bleeding, Gland problems, HIV and Persistent Infections.  Vitals Lars Mage Spillers CMA; 07/02/2015 2:11 PM) 07/02/2015  2:11 PM Weight: 226 lb Height: 63.5in Body Surface Area: 2.05 m Body Mass Index: 39.41 kg/m  Pulse: 86 (Regular)  BP: 142/82 (Sitting, Left Arm, Standard)   Physical Exam Rolm Bookbinder MD; 07/02/2015 3:16 PM) General Mental Status-Alert. Orientation-Oriented X3. Chest and Lung Exam Chest and lung exam reveals -on auscultation, normal breath sounds, no adventitious sounds and normal vocal resonance. Breast Nipples-No Discharge. Breast Lump-No Palpable Breast Mass. Cardiovascular Cardiovascular examination reveals -normal heart sounds, regular rate and rhythm with no murmurs. Lymphatic Head & Neck General Head & Neck Lymphatics: Bilateral - Description - Normal. Axillary General Axillary Region: Bilateral - Description - Normal. Note: no  adenopathy  Assessment & Plan Rolm Bookbinder MD; 07/02/2015 3:23 PM) BREAST NEOPLASM, TIS (DCIS), RIGHT (D05.11) Story: Right total mastectomy, right axillary sn biopsy We discussed the staging and pathophysiology of breast cancer. We discussed all of the different options for treatment for breast cancer including surgery, chemotherapy, radiation therapy, Herceptin, and antiestrogen therapy. We discussed a sentinel lymph node biopsy as she does not appear to having lymph node involvement right now. We discussed the performance of that with injection of radioactive tracer. We discussed that there is a chance of having a positive node with a sentinel lymph node biopsy and we will await the permanent pathology to make any other first further decisions in  terms of her treatment. . We discussed up to a 5% risk lifetime of chronic shoulder pain as well as lymphedema associated with a sentinel lymph node biopsy. We discussed the options for treatment of the breast cancer which included lumpectomy versus a mastectomy. We discussed the performance of the lumpectomy with radioactive seed placement. We discussed a 5-10% chance of a positive margin requiring reexcision in the operating room. We also discussed that she will likely need radiation therapy if she undergoes lumpectomy. We discussed the mastectomy (removal of whole breast) and the postoperative care for that as well. Mastectomy can be followed by reconstruction. The decision for lumpectomy vs mastectomy has no impact on decision for chemotherapy. Most mastectomy patients will not need radiation therapy. We discussed that there is no difference in her survival whether she undergoes lumpectomy with radiation therapy or antiestrogen therapy versus a mastectomy. There is also no real difference between her recurrence in the breast. We discussed the risks of operation including bleeding, infection, possible reoperation. She understands her further therapy will be based on what her stages at the time of her operation. Impression: she very much wants bilateral total mastectomies. I would do bilateral sentinel nodes. I think on right side she would need reduction lumpectomy with plastics involvement and she does not want to do this. She does not want radiation if at all possible. this is leading to her decision as well. I offered for her to see plastic surgery and rad onc and she declined. 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

## 2015-07-14 NOTE — Brief Op Note (Signed)
07/14/2015  10:26 AM  PATIENT:  Rachel Castaneda  70 y.o. female  PRE-OPERATIVE DIAGNOSIS:  BILATERAL BREAST CANCER  POST-OPERATIVE DIAGNOSIS:  BILATERAL BREAST CANCER  PROCEDURE:  Procedure(s): BILATERAL TOTAL MASTECTOMIES WITH LEFT AXILLARY SENTINEL LYMPH NODE BIOPSY (Bilateral)  SURGEON:  Surgeon(s) and Role:    * Rolm Bookbinder, MD - Primary  PHYSICIAN ASSISTANT:   ASSISTANTS: none   ANESTHESIA:   general with bilateral pec blocks  EBL:  Total I/O In: 1000 [I.V.:1000] Out: -   BLOOD ADMINISTERED:none  DRAINS: 2 19 fr blakes on left, 1 19 fr blake on right   LOCAL MEDICATIONS USED:  NONE  SPECIMEN:  Source of Specimen:  breast  DISPOSITION OF SPECIMEN:  PATHOLOGY  COUNTS:  YES  TOURNIQUET:  * No tourniquets in log *  DICTATION: .Dragon Dictation  PLAN OF CARE: Admit for overnight observation  PATIENT DISPOSITION:  PACU - hemodynamically stable.   Delay start of Pharmacological VTE agent (>24hrs) due to surgical blood loss or risk of bleeding: yes

## 2015-07-14 NOTE — Interval H&P Note (Signed)
History and Physical Interval Note:  07/14/2015 7:19 AM  Rachel Castaneda  has presented today for surgery, with the diagnosis of BILATERAL BREAST CANCER  The various methods of treatment have been discussed with the patient and family. After consideration of risks, benefits and other options for treatment, the patient has consented to  Procedure(s): BILATERAL TOTAL MASTECTOMIES WITH BILATERAL SENTINEL LYMPH NODE BIOPSIES (Bilateral) as a surgical intervention .  The patient's history has been reviewed, patient examined, no change in status, stable for surgery.  I have reviewed the patient's chart and labs.  Questions were answered to the patient's satisfaction.     Lonzie Simmer

## 2015-07-15 ENCOUNTER — Encounter (HOSPITAL_BASED_OUTPATIENT_CLINIC_OR_DEPARTMENT_OTHER): Payer: Self-pay | Admitting: General Surgery

## 2015-07-15 DIAGNOSIS — D0511 Intraductal carcinoma in situ of right breast: Secondary | ICD-10-CM | POA: Diagnosis not present

## 2015-07-15 LAB — CBC
HCT: 34.8 % — ABNORMAL LOW (ref 36.0–46.0)
Hemoglobin: 11.3 g/dL — ABNORMAL LOW (ref 12.0–15.0)
MCH: 30.8 pg (ref 26.0–34.0)
MCHC: 32.5 g/dL (ref 30.0–36.0)
MCV: 94.8 fL (ref 78.0–100.0)
Platelets: 191 10*3/uL (ref 150–400)
RBC: 3.67 MIL/uL — ABNORMAL LOW (ref 3.87–5.11)
RDW: 12.8 % (ref 11.5–15.5)
WBC: 11.1 10*3/uL — ABNORMAL HIGH (ref 4.0–10.5)

## 2015-07-15 NOTE — Addendum Note (Signed)
Addendum  created 07/15/15 [id number] by Ernesta Amble Yonas Bunda, CRNA   Modules edited: Charges VN

## 2015-07-15 NOTE — Op Note (Addendum)
Surgery was done on 6/5, I dictated again on 6/6 as original dictation I dont think went through  Preoperative diagnosis: left stage I breast cancer, right stage 0 breast cancer Postoperative diagnosis: same as above Procedure:  1. Right total mastectomy 2. Excision of excess axillary skin on right measuring 17x45 cm with vy plasty closure 3. Left total mastectomy 4. Left axillary sentinel node biopsy 5. Excision of left axillary skin measuring 25x6 cm with vy plasty closure Surgeon: Dr Serita Grammes Anesthesia: general with bilateral pectoral block EBL: 100 cc Drains 2 19 Fr Blake drains on the left, 1 19 Fr blake drain on the right Specimens: right and left breast marked short superior long lateral and double deep, left axillary nodes, excess axillary skin bilaterally Complications none Sponge and needle count correct dispo to recovery stable  Indications: This is a 29 yof who has bilateral breast cancers. She has clinical stage I left breast cancer and stage 0 right breast cancer. She desired bilateral mastectomies and did not want to either discuss reconstruction or radiotherapy.    Procedure: After informed consent was obtained the patient was taken to the OR. She was given antibiotics and scds were in place. She had pectoral blocks. she had bilateral technetium injected in a periareolar fashion. She was placed under general anesthesia without complication. She was prepped and draped in the standard sterile surgical fashion. A timeout was performed.  I performed the left mastectomy first. I made an elliptical incision that encompassed a lot of extra skin as well as the NAC.  I created flaps to remove all the breast tissue to the IM fold, parasternal region and the upper chest. I rolled this laterally off of the pectoralis muscle including the fascia to the latissimus dorsi.  I then removed the breast and marked as above.  I then used the neoprobe to locate a sentinel node bundle and  this was removed. There was no background radioactivity present. I obtained hemostasis. Irrigation was performed.  I placed 2 19 Fr Blake drains in the space. I closed the medial 2/3's of the incision with dermal 3-0 vicryls.  There was a lot of soft tissue present laterally. I excised this area and then closed it in a Y fashion to decrease soft tissue laterally. I then closed the skin with 4-0 monocryl. Glue and steristrips were applied. I then turned my attention to the right side.   I made an elliptical incision that encompassed a lot of extra skin as well as the NAC just as I did on the left .  I created flaps to remove all the breast tissue to the IM fold, parasternal region and the upper chest. I rolled this laterally off of the pectoralis muscle including the fascia to the latissimus dorsi.  I then removed the breast and marked as above.  I then used the neoprobe and was unable to locate any radioactivity on the right side. Due to the fact she had dcis only on that side, I did not do anything further.  I obtained hemostasis. Irrigation was performed.  I placed a 19 Fr Blake drain in the space. I closed the medial 2/3's of the incision with dermal 3-0 vicryls again..  There was a lot of soft tissue present laterally more so than on the other side.  I excised this area and then closed it in a Y fashion to decrease soft tissue laterally. I then closed the skin with 4-0 monocryl. Glue and steristrips were applied. A  binder was placed.  She was extubated and then transferred to recovery stable.

## 2015-07-15 NOTE — Discharge Instructions (Signed)
°Post Anesthesia Home Care Instructions ° °Activity: °Get plenty of rest for the remainder of the day. A responsible adult should stay with you for 24 hours following the procedure.  °For the next 24 hours, DO NOT: °-Drive a car °-Operate machinery °-Drink alcoholic beverages °-Take any medication unless instructed by your physician °-Make any legal decisions or sign important papers. ° °Meals: °Start with liquid foods such as gelatin or soup. Progress to regular foods as tolerated. Avoid greasy, spicy, heavy foods. If nausea and/or vomiting occur, drink only clear liquids until the nausea and/or vomiting subsides. Call your physician if vomiting continues. ° °Special Instructions/Symptoms: °Your throat may feel dry or sore from the anesthesia or the breathing tube placed in your throat during surgery. If this causes discomfort, gargle with warm salt water. The discomfort should disappear within 24 hours. ° °If you had a scopolamine patch placed behind your ear for the management of post- operative nausea and/or vomiting: ° °1. The medication in the patch is effective for 72 hours, after which it should be removed.  Wrap patch in a tissue and discard in the trash. Wash hands thoroughly with soap and water. °2. You may remove the patch earlier than 72 hours if you experience unpleasant side effects which may include dry mouth, dizziness or visual disturbances. °3. Avoid touching the patch. Wash your hands with soap and water after contact with the patch. °  °About my Jackson-Pratt Bulb Drain ° °What is a Jackson-Pratt bulb? °A Jackson-Pratt is a soft, round device used to collect drainage. It is connected to a long, thin drainage catheter, which is held in place by one or two small stiches near your surgical incision site. When the bulb is squeezed, it forms a vacuum, forcing the drainage to empty into the bulb. ° °Emptying the Jackson-Pratt bulb- °To empty the bulb: °1. Release the plug on the top of the bulb. °2.  Pour the bulb's contents into a measuring container which your nurse will provide. °3. Record the time emptied and amount of drainage. Empty the drain(s) as often as your     doctor or nurse recommends. ° °Date                  Time                    Amount (Drain 1)                 Amount (Drain 2) ° °_____________________________________________________________________ ° °_____________________________________________________________________ ° °_____________________________________________________________________ ° °_____________________________________________________________________ ° °_____________________________________________________________________ ° °_____________________________________________________________________ ° °_____________________________________________________________________ ° °_____________________________________________________________________ ° °Squeezing the Jackson-Pratt Bulb- °To squeeze the bulb: °1. Make sure the plug at the top of the bulb is open. °2. Squeeze the bulb tightly in your fist. You will hear air squeezing from the bulb. °3. Replace the plug while the bulb is squeezed. °4. Use a safety pin to attach the bulb to your clothing. This will keep the catheter from     pulling at the bulb insertion site. ° °When to call your doctor- °Call your doctor if: °· Drain site becomes red, swollen or hot. °· You have a fever greater than 101 degrees F. °· There is oozing at the drain site. °· Drain falls out (apply a guaze bandage over the drain hole and secure it with tape). °· Drainage increases daily not related to activity patterns. (You will usually have more drainage when you are active than when you are resting.) °· Drainage has a bad   odor.    [facility], [place] [phone]  MASTECTOMY: POST OP INSTRUCTIONS  Always review your discharge instruction sheet given to you by the facility where your surgery was performed. IF YOU HAVE DISABILITY OR FAMILY  LEAVE FORMS, YOU MUST BRING THEM TO THE OFFICE FOR PROCESSING.   DO NOT GIVE THEM TO YOUR DOCTOR. A prescription for pain medication may be given to you upon discharge.  Take your pain medication as prescribed, if needed.  If narcotic pain medicine is not needed, then you may take acetaminophen (Tylenol), naprosyn (Alleve) or ibuprofen (Advil) as needed. 1. Take your usually prescribed medications unless otherwise directed. 2. If you need a refill on your pain medication, please contact your pharmacy.  They will contact our office to request authorization.  Prescriptions will not be filled after 5pm or on week-ends. 3. You should follow a light diet the first few days after arrival home, such as soup and crackers, etc.  Resume your normal diet the day after surgery. 4. Most patients will experience some swelling and bruising on the chest and underarm.  Ice packs will help.  Swelling and bruising can take several days to resolve. Wear the binder day and night until you return to the office.  5. It is common to experience some constipation if taking pain medication after surgery.  Increasing fluid intake and taking a stool softener (such as Colace) will usually help or prevent this problem from occurring.  A mild laxative (Milk of Magnesia or Miralax) should be taken according to package instructions if there are no bowel movements after 48 hours. 6. Unless discharge instructions indicate otherwise, leave your bandage dry and in place until your next appointment in 3-5 days.  You may take a limited sponge bath.  No tube baths or showers until the drains are removed.  You may have steri-strips (small skin tapes) in place directly over the incision.  These strips should be left on the skin for 7-10 days. If you have glue it will come off in next couple week.  Any sutures will be removed at an office visit 7. DRAINS:  If you have drains in place, it is important to keep a list of the amount of drainage produced  each day in your drains.  Before leaving the hospital, you should be instructed on drain care.  Call our office if you have any questions about your drains. I will remove your drains when they put out less than 30 cc or ml for 2 consecutive days. 8. ACTIVITIES:  You may resume regular (light) daily activities beginning the next day--such as daily self-care, walking, climbing stairs--gradually increasing activities as tolerated.  You may have sexual intercourse when it is comfortable.  Refrain from any heavy lifting or straining until approved by your doctor. a. You may drive when you are no longer taking prescription pain medication, you can comfortably wear a seatbelt, and you can safely maneuver your car and apply brakes. b. RETURN TO WORK:  __________________________________________________________ 9. You should see your doctor in the office for a follow-up appointment approximately 3-5 days after your surgery.  Your doctors nurse will typically make your follow-up appointment when she calls you with your pathology report.  Expect your pathology report 3-4business days after surgery. 10. OTHER INSTRUCTIONS: ______________________________________________________________________________________________ ____________________________________________________________________________________________ WHEN TO CALL YOUR DR Ryley Teater: 1. Fever over 101.0 2. Nausea and/or vomiting 3. Extreme swelling or bruising 4. Continued bleeding from incision. 5. Increased pain, redness, or drainage from the incision.  The clinic staff is available to answer your questions during regular business hours.  Please dont hesitate to call and ask to speak to one of the nurses for clinical concerns.  If you have a medical emergency, go to the nearest emergency room or call 911.  A surgeon from [facility] is always on call at the hospital. [address] [place], [place], [place]  [address] ? [address],  [place], [place]   [address] [phone] ? [phone] ? [phone] Web site: [email or web address]

## 2015-07-24 ENCOUNTER — Telehealth: Payer: Self-pay | Admitting: Hematology and Oncology

## 2015-07-24 ENCOUNTER — Encounter: Payer: Self-pay | Admitting: Hematology and Oncology

## 2015-07-24 NOTE — Telephone Encounter (Signed)
Appointment with Lindi Adie on 6/20 at 3:45PM. Patient agreed. Demographics verified. Letter to referring

## 2015-07-29 ENCOUNTER — Encounter: Payer: Self-pay | Admitting: *Deleted

## 2015-07-29 ENCOUNTER — Encounter: Payer: Self-pay | Admitting: Hematology and Oncology

## 2015-07-29 ENCOUNTER — Ambulatory Visit (HOSPITAL_BASED_OUTPATIENT_CLINIC_OR_DEPARTMENT_OTHER): Payer: Medicare Other | Admitting: Hematology and Oncology

## 2015-07-29 VITALS — BP 136/80 | HR 79 | Temp 98.4°F | Resp 18 | Wt 219.7 lb

## 2015-07-29 DIAGNOSIS — C50911 Malignant neoplasm of unspecified site of right female breast: Secondary | ICD-10-CM | POA: Diagnosis not present

## 2015-07-29 DIAGNOSIS — C50912 Malignant neoplasm of unspecified site of left female breast: Secondary | ICD-10-CM | POA: Diagnosis not present

## 2015-07-29 DIAGNOSIS — Z170 Estrogen receptor positive status [ER+]: Secondary | ICD-10-CM

## 2015-07-29 DIAGNOSIS — C50811 Malignant neoplasm of overlapping sites of right female breast: Secondary | ICD-10-CM

## 2015-07-29 DIAGNOSIS — C50812 Malignant neoplasm of overlapping sites of left female breast: Principal | ICD-10-CM

## 2015-07-29 NOTE — Assessment & Plan Note (Signed)
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 After reviewing the pathology in detail, we proceeded to discuss the different treatment options between  Chemotherapy and antiestrogen therapies.  Recommendation: 1. Oncotype DX testing to determine if she would benefit from systemic chemotherapy 2. Followed by adjuvant antiestrogen therapy with anastrozole 1 mg daily 5-10 years (patient appears to be very apprehensive at antiestrogen therapy and its risks especially the risk of blood clots and weight gain)  Return to clinic based upon Oncotype DX test results. If she is found to be low risk or less than 25 on the score, I will call in a prescription for anastrozole. If she is found to be more than 25, we will need to see her back to discuss systemic chemotherapy. Patient understands the role of chemotherapy or antiestrogen therapy is to decrease the risk of distant recurrence.    

## 2015-07-29 NOTE — Progress Notes (Signed)
[place] NOTE  Patient Care Team: Ernestene Kiel, MD as PCP - General (Internal Medicine)  CHIEF COMPLAINTS/PURPOSE OF CONSULTATION:  Newly diagnosed breast cancer  HISTORY OF PRESENTING ILLNESS:  Rachel Castaneda 70 y.o. 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 Patient had a routine screening mammogram for detection of abnormalities in both breasts. 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 She elected to undergo bilateral mastectomies. Final pathology came back and she is here today to discuss the pathology results. She is healing fairly well from recent surgeries and denies significant pain or discomfort.  Patient has had multiple spinal surgeries and was previously on multiple pain medications. She has had a problem weaning off pain medications. She does feel that she is very sensitive to medications and is very anxious about antiestrogen pills.  I reviewed her records extensively and collaborated the history with the patient.  SUMMARY OF ONCOLOGIC HISTORY:   Bilateral breast cancer ([place])   07/14/2015 Surgery Bilateral mastectomies: Left: IDC grade to 1.7 cm, with DCIS, LVI present, 0/5 lymph nodes negative, T1 cN0 stage IA, ER 100%, PR positive, HER-2 negative, Ki-67 15%; right: DCIS with comedonecrosis 1.3 cm, grade 2, Tis N0 stage 0, ER/PR 100%   MEDICAL HISTORY:  Past Medical History  Diagnosis Date  . Varicose veins   . UTI (urinary tract infection)   . Other specified intestinal malabsorption ([place])   . Family history of anesthesia complication     daughter- extremely agitated   . Anxiety     pt. admits to anxiety, currently, not taking meds.   . Fall     related to dog attack- Nov. 11.2013  . Headache(784.0)     h/o migraines   . Anemia   . Hypertension     HTN- prior to gastric bypass, seen by cardiologist- stress test done, last one mid 1990's- pt. reports it was WNL.  Marland Kitchen HTN (hypertension)     no meds in 78yr  . Sleep apnea     used CPAP- prior to gastric bypass, no longer needs.  . Diabetes mellitus     Type II, prior to gastric bypass , off Metformin since 2008  . Arthritis     lumbar region  . Dysrhythmia     irreg heart beats    SURGICAL HISTORY: Past Surgical History  Procedure Laterality Date  . Hernia repair  1997  and   2001  . Cystectomy  2000    Sinus cyst  . Gastric bypass  2000  . Cholecystectomy  2000    Gall Bladder  . Colonoscopy  2008  . Abdominal hysterectomy  1989  . Carpal tunnel release      bilateral  . Tonsillectomy    . Cesarean section      x3  . Back surgery  2014    lumbar fusion  . Nasal sinus surgery    . Mastectomy w/ sentinel node biopsy Bilateral 07/14/2015    Procedure: BILATERAL TOTAL MASTECTOMIES WITH LEFT AXILLARY SENTINEL LYMPH NODE BIOPSY;  Surgeon: MRolm Bookbinder MD;  Location[place] [facility]  Service: General;  Laterality: Bilateral;    SOCIAL HISTORY: Social History   Social History  . Marital Status: Divorced    Spouse Name: N/A  . Number of Children: N/A  . Years of Education: N/A  Occupational History  . Not on file.   Social History Main Topics  . Smoking status: Never Smoker   . Smokeless tobacco: Not on file  . Alcohol Use: Yes     Comment: rarely  . Drug Use: No  . Sexual Activity: Yes    Birth Control/ Protection: Surgical   Other Topics Concern  . Not on file   Social History Narrative    FAMILY HISTORY:Patient has family history of ovarian cancer Family History  Problem Relation Age of Onset  . Cancer Mother     Skin cancer  . Stroke Mother   . Hypertension Mother     ALLERGIES:  is allergic to codeine; baclofen; dairy aid; gabapentin; ibuprofen; topamax; adhesive; nsaids; and tramadol.  MEDICATIONS:  Current Outpatient  Prescriptions  Medication Sig Dispense Refill  . ALPHA LIPOIC ACID PO Take 100 mg by mouth daily.    Marland Kitchen aspirin 81 MG tablet Take 81 mg by mouth daily.    . Biotin 1000 MCG tablet Take 1,000 mcg by mouth daily.     . cholecalciferol (VITAMIN D) 1000 UNITS tablet Take 1,000 Units by mouth daily.    . Cyanocobalamin (VITAMIN B 12 PO) Take 2,500 mcg by mouth once a week.     . ferrous sulfate 325 (65 FE) MG EC tablet Take 65 mg by mouth daily with breakfast.     . metroNIDAZOLE (METROGEL) 0.75 % gel Apply 1 application topically daily.    . Multiple Vitamin (MULTIVITAMIN) tablet Take 1 tablet by mouth daily.    . Potassium 99 MG TABS Take 99 mg by mouth daily.      No current facility-administered medications for this visit.    REVIEW OF SYSTEMS:   Constitutional: Denies fevers, chills or abnormal night sweats Eyes: Denies blurriness of vision, double vision or watery eyes Ears, nose, mouth, throat, and face: Denies mucositis or sore throat Respiratory: Denies cough, dyspnea or wheezes Cardiovascular: Denies palpitation, chest discomfort or lower extremity swelling Gastrointestinal:  Denies nausea, heartburn or change in bowel habits Skin: Denies abnormal skin rashes Lymphatics: Denies new lymphadenopathy or easy bruising Neurological:Denies numbness, tingling or new weaknesses Behavioral/Psych: Mood is stable, no new changes  Breast: Recent bilateral mastectomies All other systems were reviewed with the patient and are negative.  PHYSICAL EXAMINATION: ECOG PERFORMANCE STATUS: 1 - Symptomatic but completely ambulatory  Filed Vitals:   07/29/15 1524  BP: 136/80  Pulse: 79  Temp: 98.4 F (36.9 C)  Resp: 18   Filed Weights   07/29/15 1524  Weight: 219 lb 11.2 oz (99.655 kg)    GENERAL:alert, no distress and comfortable SKIN: skin color, texture, turgor are normal, no rashes or significant lesions EYES: normal, conjunctiva are pink and non-injected, sclera clear OROPHARYNX:no  exudate, no erythema and lips, buccal mucosa, and tongue normal  NECK: supple, thyroid normal size, non-tender, without nodularity LYMPH:  no palpable lymphadenopathy in the cervical, axillary or inguinal LUNGS: clear to auscultation and percussion with normal breathing effort HEART: regular rate & rhythm and no murmurs and no lower extremity edema ABDOMEN:abdomen soft, non-tender and normal bowel sounds Musculoskeletal:no cyanosis of digits and no clubbing  PSYCH: alert & oriented x 3 with fluent speech NEURO: no focal motor/sensory deficits  LABORATORY DATA:  I have reviewed the data as listed Lab Results  Component Value Date   WBC 11.1* 07/15/2015   HGB 11.3* 07/15/2015   HCT 34.8* 07/15/2015   MCV 94.8 07/15/2015   PLT 191 07/15/2015  Lab Results  Component Value Date   NA 142 07/10/2015   K 3.5 07/10/2015   CL 105 07/10/2015   CO2 29 07/10/2015    RADIOGRAPHIC STUDIES: I have personally reviewed the radiological reports and agreed with the findings in the report.  ASSESSMENT AND PLAN:  Bilateral breast cancer ([place]) Bilateral mastectomies 07/14/2015: Left: IDC grade to 1.7 cm, with DCIS, LVI present, 0/5 lymph nodes negative, T1 cN0 stage IA, ER 100%, PR positive, HER-2 negative, Ki-67 15%; right: DCIS with comedonecrosis 1.3 cm, grade 2, Tis N0 stage 0, ER/PR 100%  Pathology review: Discussed with the patient, the details of pathology including the type of breast cancer,the clinical staging, the significance of ER, PR and HER-2/neu receptors and the implications for treatment. After reviewing the pathology in detail, we proceeded to discuss the different treatment options between  Chemotherapy and antiestrogen therapies.  Recommendation: 1. Oncotype DX testing to determine if she would benefit from systemic chemotherapy 2. Followed by adjuvant antiestrogen therapy with anastrozole 1 mg daily 5-10 years (patient appears to be very apprehensive at antiestrogen therapy and  its risks especially the risk of blood clots and weight gain)  Return to clinic based upon Oncotype DX test results. If she is found to be low risk or less than 24 on the score, I will call in a prescription for anastrozole. If she is found to be more than 25, we will need to see her back to discuss systemic chemotherapy. Patient understands the role of chemotherapy or antiestrogen therapy is to decrease the risk of distant recurrence.  All questions were answered. The patient knows to call the clinic with any problems, questions or concerns.    Rulon Eisenmenger, MD 07/29/2015

## 2015-08-05 ENCOUNTER — Other Ambulatory Visit: Payer: Self-pay | Admitting: *Deleted

## 2015-08-05 DIAGNOSIS — C50811 Malignant neoplasm of overlapping sites of right female breast: Secondary | ICD-10-CM

## 2015-08-05 DIAGNOSIS — C50812 Malignant neoplasm of overlapping sites of left female breast: Principal | ICD-10-CM

## 2015-08-14 ENCOUNTER — Ambulatory Visit: Payer: Medicare Other | Attending: General Surgery | Admitting: Physical Therapy

## 2015-08-14 ENCOUNTER — Encounter: Payer: Self-pay | Admitting: Physical Therapy

## 2015-08-14 DIAGNOSIS — M25611 Stiffness of right shoulder, not elsewhere classified: Secondary | ICD-10-CM | POA: Insufficient documentation

## 2015-08-14 DIAGNOSIS — M25612 Stiffness of left shoulder, not elsewhere classified: Secondary | ICD-10-CM | POA: Insufficient documentation

## 2015-08-14 DIAGNOSIS — R600 Localized edema: Secondary | ICD-10-CM | POA: Insufficient documentation

## 2015-08-14 NOTE — Therapy (Signed)
[place], [place], [address] Phone: [phone]   Fax:  [phone]  Physical Therapy Evaluation  Patient Details  Name: Rachel Castaneda MRN: [id number] Date of Birth: 1945/05/14 Referring Provider: Dr. Donne Hazel  Encounter Date: 08/14/2015      PT End of Session - 08/14/15 1157    Visit Number 1   Number of Visits 5   Date for PT Re-Evaluation 09/18/15   PT Start Time 1101   PT Stop Time 1147   PT Time Calculation (min) 46 min   Activity Tolerance Patient tolerated treatment well   Behavior During Therapy [facility] for tasks assessed/performed      Past Medical History  Diagnosis Date  . Varicose veins   . UTI (urinary tract infection)   . Other specified intestinal malabsorption ([place])   . Family history of anesthesia complication     daughter- extremely agitated   . Anxiety     pt. admits to anxiety, currently, not taking meds.   . Fall     related to dog attack- Nov. 11.2013  . Headache(784.0)     h/o migraines   . Anemia   . Hypertension     HTN- prior to gastric bypass, seen by cardiologist- stress test done, last one mid 1990's- pt. reports it was WNL.  Marland Kitchen HTN (hypertension)     no meds in 25yrs  . Sleep apnea     used CPAP- prior to gastric bypass, no longer needs.  . Diabetes mellitus     Type II, prior to gastric bypass , off Metformin since 2008  . Arthritis     lumbar region  . Dysrhythmia     irreg heart beats    Past Surgical History  Procedure Laterality Date  . Hernia repair  1997  and   2001  . Cystectomy  2000    Sinus cyst  . Gastric bypass  2000  . Cholecystectomy  2000    Gall Bladder  . Colonoscopy  2008  . Abdominal hysterectomy  1989  . Carpal tunnel release      bilateral  . Tonsillectomy    . Cesarean section      x3  . Back surgery  2014    lumbar fusion  . Nasal sinus surgery    . Mastectomy w/ sentinel node biopsy Bilateral 07/14/2015    Procedure:  BILATERAL TOTAL MASTECTOMIES WITH LEFT AXILLARY SENTINEL LYMPH NODE BIOPSY;  Surgeon: Rolm Bookbinder, MD;  Location: [facility];  Service: General;  Laterality: Bilateral;    There were no vitals filed for this visit.       Subjective Assessment - 08/14/15 1110    Subjective Pt states she has tightness across her chest with upper extremity movement. She has been having back pain since her lumbar fusion. She also reports she has a wound that has not closed and has been infected which has caused some pain and edema in left trunk.    Pertinent History hx of L4-L5 lumbar fusion 04/2012, bilateral mastectomy 07/14/15, pt does not need radiation or hormone therapy and she is waiting for test results to determine if she needs chemo   Patient Stated Goals exercises to decrease back pain, and UE exercises that won't hurt her back, to decrease chest tightness   Currently in Pain? Yes   Pain Score 2    Pain Location Breast   Pain Orientation Right;Left   Pain Descriptors / Indicators Burning  Pain Type Surgical pain   Pain Onset 1 to 4 weeks ago   Pain Frequency Constant   Aggravating Factors  sit up a long time without every lying down   Pain Relieving Factors laying down and taking an aspirin   Effect of Pain on Daily Activities pt states it does not effect daily activities            [facility] PT Assessment - 08/14/15 0001    Assessment   Medical Diagnosis bilateral breast cancer   Referring Provider Dr. Donne Hazel   Onset Date/Surgical Date 07/14/15   Hand Dominance Right   Prior Therapy none   Precautions   Precautions Other (comment)  lymphedema   Restrictions   Weight Bearing Restrictions No   Balance Screen   Has the patient fallen in the past 6 months No   Has the patient had a decrease in activity level because of a fear of falling?  Yes  pt afraid of getting attacked by loose dog on her road    Is the patient reluctant to leave their home because of a fear of  falling?  No   Home Environment   Living Environment Private residence   Living Arrangements Spouse/significant other   Available Help at Discharge Family   Type of [place] to enter   Entrance Stairs-Number of Steps 3   Entrance Stairs-Rails Can reach both   [place] One level   Home Equipment Shower seat;Cane - single point   Prior Function   Level of [place] Retired   Leisure pt was doing yoga prior to surgery but has not recently   Cognition   Overall Cognitive Status Within Functional Limits for tasks assessed   Observation/Other Assessments   Other Surveys  Other Surveys  LLIS: 16% impairment   AROM   Right Shoulder Flexion 158 Degrees   Right Shoulder ABduction 151 Degrees   Right Shoulder Internal Rotation 64 Degrees   Right Shoulder External Rotation 75 Degrees   Left Shoulder Flexion 146 Degrees   Left Shoulder ABduction 150 Degrees   Left Shoulder Internal Rotation 46 Degrees   Left Shoulder External Rotation 59 Degrees           LYMPHEDEMA/ONCOLOGY QUESTIONNAIRE - 08/14/15 1127    Type   Cancer Type bilateral breast cancer   Surgeries   Mastectomy Date 07/14/15   Sentinel Lymph Node Biopsy Date 07/14/15  on L only   Number Lymph Nodes Removed 3  on L all negative   Date Lymphedema/Swelling Started   Date 07/14/15  pt feels it is surgical swelling   Treatment   Active Chemotherapy Treatment No   Past Chemotherapy Treatment No   Active Radiation Treatment No   Past Radiation Treatment No   Current Hormone Treatment No   Past Hormone Therapy No   What other symptoms do you have   Are you Having Heaviness or Tightness No   Are you having Pain Yes   Are you having pitting edema No   Is it Hard or Difficult finding clothes that fit No   Do you have infections No   Is there Decreased scar mobility Yes   Lymphedema Assessments   Lymphedema Assessments Upper extremities   Right Upper Extremity  Lymphedema   15 cm Proximal to Olecranon Process 36.1 cm   Olecranon Process 26.8 cm   15 cm Proximal to Ulnar Styloid Process 26.2 cm   Just Proximal  to Ulnar Styloid Process 17.2 cm   Across Hand at [employer] 20 cm   At [place] of 2nd Digit 6.2 cm   Left Upper Extremity Lymphedema   15 cm Proximal to Olecranon Process 36 cm   Olecranon Process 27 cm   15 cm Proximal to Ulnar Styloid Process 26 cm   Just Proximal to Ulnar Styloid Process 17.5 cm   Across Hand at [employer] 19.9 cm   At [place] of 2nd Digit 6.4 cm           [place] - 08/14/15 0001    Open a tight or new jar Mild difficulty   Do heavy household chores (wash walls, wash floors) --  pt states she has not attempted   Carry a shopping bag or briefcase --  pt states she has not attempted   Wash your back No difficulty   Use a knife to cut food No difficulty   Recreational activities in which you take some force or impact through your arm, shoulder, or hand (golf, hammering, tennis) --  pt has not attempted   During the past week, to what extent has your arm, shoulder or hand problem interfered with your normal social activities with family, friends, neighbors, or groups? Not at all   During the past week, to what extent has your arm, shoulder or hand problem limited your work or other regular daily activities Slightly   Arm, shoulder, or hand pain. Mild   Tingling (pins and needles) in your arm, shoulder, or hand None   Difficulty Sleeping No difficulty   DASH Score 0 %                     PT Education - 08/14/15 1156    Education provided Yes   Education Details ABC class flyer   Person(s) Educated Patient   Methods Explanation;Handout   Comprehension Verbalized understanding                [place] - 08/14/15 1206    CC Long Term Goal  #1   Title Pt will be independent with a home exercise program for continued stretching and strengthening.    Time 4   Period  Weeks   Status New   CC Long Term Goal  #2   Title Pt will demonstrate increased left shoulder flexion by 10 degrees to allow pt to return to prior level of function.    Baseline 146   Time 4   Period Weeks   Status New   CC Long Term Goal  #3   Title Pt will reports a 50 percent improvement in tightness across chest to allow improved comfort.   Time 4   Period Weeks   Status New   CC Long Term Goal  #4   Title Pt will be independent in verbalizing lymphedema risk reduction practices   Time 4   Period Weeks   Status New            Plan - 08/14/15 1157    Clinical Impression Statement Pt arrives to PT following bilateral mastectomy with sentinel lymph node biopsy on L. She does not need radiation and is awaiting test results to determine if she needs chemotherapy. Overall pt is doing very well. Her ROM is limited bilaterally with L greater than R. She also has a history of lumbar fusion and continues to have back pain. Pt would benefit from ROM and  strengthening exercises for bilateral UE and core to help decrease back pain and allow increased UE function. Pt was given a handout for ABC class today. She lives 30 min away and can only come once per week. Increased tightness across pec major can be felt on L and R.    Rehab Potential Excellent   Clinical Impairments Affecting Rehab Potential pt can only come 1x/wk   PT Frequency 1x / week   PT Duration 4 weeks   PT Treatment/Interventions Manual techniques;Manual lymph drainage;Therapeutic exercise;Taping;Passive range of motion;Patient/family education;Scar mobilization;ADLs/Self Care Home Management   PT Next Visit Plan begin instructing in ROM exercises for HEP, give core exercises, ex for pec tightness   Consulted and Agree with Plan of Care Patient      Patient will benefit from skilled therapeutic intervention in order to improve the following deficits and impairments:  Decreased range of motion, Decreased strength, Increased  fascial restricitons, Pain, Increased edema, Decreased scar mobility  Visit Diagnosis: Stiffness of left shoulder, not elsewhere classified - Plan: PT plan of care cert/re-cert  Stiffness of right shoulder, not elsewhere classified - Plan: PT plan of care cert/re-cert  Localized edema - Plan: PT plan of care cert/re-cert      G-Codes - 99991111 1209/05/19    Functional Assessment Tool Used Per clinical judgment   Functional Limitation Carrying, moving and handling objects   Carrying, Moving and Handling Objects Current Status [id number]) At least 20 percent but less than 40 percent impaired, limited or restricted   Carrying, Moving and Handling Objects Goal Status [id number]) At least 1 percent but less than 20 percent impaired, limited or restricted       Problem List Patient Active Problem List   Diagnosis Date Noted  . Bilateral breast cancer ([place]) 07/14/2015  . Varicose veins of lower extremities with other complications 0000000  . Pain of right leg 10/22/2011  . Chronic venous insufficiency 10/22/2011    Alexia Freestone 08/14/2015, 12:14 PM  [place], [place], [address] Phone: [phone]   Fax:  [phone]  Name: Rachel Castaneda MRN: [id number] Date of Birth: 1945-10-18   Allyson Sabal, PT 08/14/2015 12:14 PM

## 2015-08-19 ENCOUNTER — Ambulatory Visit: Payer: Medicare Other | Admitting: Physical Therapy

## 2015-08-19 DIAGNOSIS — M25611 Stiffness of right shoulder, not elsewhere classified: Secondary | ICD-10-CM

## 2015-08-19 DIAGNOSIS — M25612 Stiffness of left shoulder, not elsewhere classified: Secondary | ICD-10-CM | POA: Diagnosis not present

## 2015-08-19 DIAGNOSIS — R600 Localized edema: Secondary | ICD-10-CM

## 2015-08-19 NOTE — Therapy (Addendum)
[place], [place], [address] Phone: [phone]   Fax:  [phone]  Physical Therapy Treatment  Patient Details  Name: Rachel Castaneda MRN: [id number] Date of Birth: Jul 03, 1945 Referring Provider: Dr. Donne Hazel  Encounter Date: 08/19/2015      PT End of Session - 08/19/15 1404    Visit Number 2   Number of Visits 5   Date for PT Re-Evaluation 09/18/15   PT Start Time 4128   PT Stop Time 1345   PT Time Calculation (min) 40 min   Activity Tolerance Patient tolerated treatment well   Behavior During Therapy [facility] for tasks assessed/performed      Past Medical History  Diagnosis Date  . Varicose veins   . UTI (urinary tract infection)   . Other specified intestinal malabsorption ([place])   . Family history of anesthesia complication     daughter- extremely agitated   . Anxiety     pt. admits to anxiety, currently, not taking meds.   . Fall     related to dog attack- Nov. 11.2013  . Headache(784.0)     h/o migraines   . Anemia   . Hypertension     HTN- prior to gastric bypass, seen by cardiologist- stress test done, last one mid 1990's- pt. reports it was WNL.  Marland Kitchen HTN (hypertension)     no meds in 9yr  . Sleep apnea     used CPAP- prior to gastric bypass, no longer needs.  . Diabetes mellitus     Type II, prior to gastric bypass , off Metformin since 2008  . Arthritis     lumbar region  . Dysrhythmia     irreg heart beats    Past Surgical History  Procedure Laterality Date  . Hernia repair  1997  and   2001  . Cystectomy  2000    Sinus cyst  . Gastric bypass  2000  . Cholecystectomy  2000    Gall Bladder  . Colonoscopy  2008  . Abdominal hysterectomy  1989  . Carpal tunnel release      bilateral  . Tonsillectomy    . Cesarean section      x3  . Back surgery  2014    lumbar fusion  . Nasal sinus surgery    . Mastectomy w/ sentinel node biopsy Bilateral 07/14/2015    Procedure:  BILATERAL TOTAL MASTECTOMIES WITH LEFT AXILLARY SENTINEL LYMPH NODE BIOPSY;  Surgeon: MRolm Bookbinder MD;  Location: [facility]  Service: General;  Laterality: Bilateral;    There were no vitals filed for this visit.      Subjective Assessment - 08/19/15 1308    Subjective Pt has not heard back about if she will need to have chemotherapy   Her husband is also in process of beginning prostate cancer treatment.  They are not able to afford all the copays, so she would like to hold off until next month.  She will call back when she is ready for her next appointment    Pertinent History hx of L4-L5 lumbar fusion 04/2012, bilateral mastectomy 07/14/15, pt does not need radiation or hormone therapy and she is waiting for test results to determine if she needs chemo   Patient Stated Goals exercises to decrease back pain, and UE exercises that won't hurt her back, to decrease chest tightness   Currently in Pain? Yes   Pain Score --  did not rate    Pain  Location Chest   Pain Orientation Right;Left   Pain Descriptors / Indicators Tightness   Pain Type Surgical pain                         [facility] Adult PT Treatment/Exercise - 08/19/15 0001    Knee/Hip Exercises: Standing   Wall Squat 5 reps   Wall Squat Limitations wall taps with "power up" with glute squeeze    Shoulder Exercises: Supine   Horizontal ABduction Strengthening;5 reps;Theraband   Theraband Level (Shoulder Horizontal ABduction) Level 1 (Yellow)   External Rotation Strengthening;Both;5 reps;Theraband   Theraband Level (Shoulder External Rotation) Level 1 (Yellow)   Flexion Strengthening;Both;5 reps;Theraband  wide and narrow grip    Theraband Level (Shoulder Flexion) Level 1 (Yellow)   Other Supine Exercises [place] spinal realignment exercises   Other Supine Exercises supine scapular series with  yellow theraband  diagonal arm raise                         Long Term Clinic Goals -  08/19/15 1404    CC Long Term Goal  #1   Title Pt will be independent with a home exercise program for continued stretching and strengthening.    Time 4   Period Weeks   Status On-going   CC Long Term Goal  #2   Title Pt will demonstrate increased left shoulder flexion by 10 degrees to allow pt to return to prior level of function.    Baseline 146   Time 4   Period Weeks   Status On-going   CC Long Term Goal  #3   Title Pt will reports a 50 percent improvement in tightness across chest to allow improved comfort.   Time 4   Period Weeks   Status On-going   CC Long Term Goal  #4   Title Pt will be independent in verbalizing lymphedema risk reduction practices   Time 4   Period Weeks   Status On-going            Plan - 08/19/15 1405    Clinical Impression Statement Pt did well with exercise today for posture and for scapular strengthening.  She plans to begin doing chair yoga again.  She has lots of appointments right now and limited income so will hold off on returning to PT until next month and will continue her exercise on her own at home    Rehab Potential Excellent   Clinical Impairments Affecting Rehab Potential pt can only come 1x/wk   PT Next Visit Plan Remeasure shoulder ROM.  Issue handouts from ABC class and briefly review decreasing lymphedema risk, Consider deltoid, biceps and triceps strength in additional to [place] exercise    Consulted and Agree with Plan of Care Patient      Patient will benefit from skilled therapeutic intervention in order to improve the following deficits and impairments:  Decreased range of motion, Decreased strength, Increased fascial restricitons, Pain, Increased edema, Decreased scar mobility  Visit Diagnosis: Stiffness of left shoulder, not elsewhere classified  Stiffness of right shoulder, not elsewhere classified  Localized edema     Problem List Patient Active Problem List   Diagnosis Date Noted  . Bilateral breast  cancer ([place]) 07/14/2015  . Varicose veins of lower extremities with other complications 19/14/7829  . Pain of right leg 10/22/2011  . Chronic venous insufficiency 10/22/2011   Donato Heinz. Owens Shark, PT  [place] 08/19/2015,  2:09 PM  [place], [place], [address] Phone: [phone]   Fax:  [phone]  Name: Rachel Castaneda MRN: [id number] Date of Birth: [birth year]   PHYSICAL THERAPY DISCHARGE SUMMARY  Visits from Start of Care: 2  Current functional level related to goals / functional outcomes: unknown   Remaining deficits: unknown   Education / Equipment: As above  Plan: Patient agrees to discharge.  Patient goals were not met. Patient is being discharged due to not returning since the last visit.  ?????     Maudry Diego, PT 07/06/17 10:38 AM

## 2015-08-19 NOTE — Patient Instructions (Signed)
Over Head Pull: Narrow Grip        On back, knees bent, feet flat, band across thighs, elbows straight but relaxed. Pull hands apart (start). Keeping elbows straight, bring arms up and over head, hands toward floor. Keep pull steady on band. Hold momentarily. Return slowly, keeping pull steady, back to start. Repeat _5-10__ times. Band color _yellow____   Side Pull: Double Arm   On back, knees bent, feet flat. Arms perpendicular to body, shoulder level, elbows straight but relaxed. Pull arms out to sides, elbows straight. Resistance band comes across collarbones, hands toward floor. Hold momentarily. Slowly return to starting position. Repeat 5-10___ times. Band color __yellow___   Sash   On back, knees bent, feet flat, left hand on left hip, right hand above left. Pull right arm DIAGONALLY (hip to shoulder) across chest. Bring right arm along head toward floor. Hold momentarily. Slowly return to starting position. Repeat __5-10_ times. Do with left arm. Band color __yellow____   Shoulder Rotation: Double Arm   On back, knees bent, feet flat, elbows tucked at sides, bent 90, hands palms up. Pull hands apart and down toward floor, keeping elbows near sides. Hold momentarily. Slowly return to starting position. Repeat _5-10__ times. Band color _yellow_____

## 2015-08-21 ENCOUNTER — Ambulatory Visit (HOSPITAL_BASED_OUTPATIENT_CLINIC_OR_DEPARTMENT_OTHER): Payer: Medicare Other | Admitting: Genetic Counselor

## 2015-08-21 ENCOUNTER — Other Ambulatory Visit: Payer: Medicare Other

## 2015-08-21 DIAGNOSIS — Z808 Family history of malignant neoplasm of other organs or systems: Secondary | ICD-10-CM

## 2015-08-21 DIAGNOSIS — Z8041 Family history of malignant neoplasm of ovary: Secondary | ICD-10-CM

## 2015-08-21 DIAGNOSIS — C50811 Malignant neoplasm of overlapping sites of right female breast: Secondary | ICD-10-CM | POA: Diagnosis not present

## 2015-08-21 DIAGNOSIS — Z8049 Family history of malignant neoplasm of other genital organs: Secondary | ICD-10-CM | POA: Diagnosis not present

## 2015-08-21 DIAGNOSIS — Z8052 Family history of malignant neoplasm of bladder: Secondary | ICD-10-CM | POA: Diagnosis not present

## 2015-08-21 DIAGNOSIS — C50812 Malignant neoplasm of overlapping sites of left female breast: Secondary | ICD-10-CM

## 2015-08-22 ENCOUNTER — Encounter: Payer: Self-pay | Admitting: Genetic Counselor

## 2015-08-22 DIAGNOSIS — Z8041 Family history of malignant neoplasm of ovary: Secondary | ICD-10-CM | POA: Insufficient documentation

## 2015-08-22 NOTE — Progress Notes (Signed)
REFERRING PROVIDER: Nicholas Lose, MD  PRIMARY PROVIDER:  PROCHNAU,CAROLINE, MD  PRIMARY REASON FOR VISIT:  1. Bilateral malignant neoplasm of overlapping sites of breast in female [facility])   2. Family history of ovarian cancer   3. Family history of uterine cancer   4. Family history of bladder cancer   5. Family history of skin cancer      HISTORY OF PRESENT ILLNESS:   Rachel Castaneda, a 70 y.o. female, was seen for a [place] cancer genetics consultation at the request of Dr. Lindi Adie due to a personal history of synchronous bilateral breast cancers and family history of ovarian, uterine, bladder, and other cancer.  [name] presents to clinic today to discuss the possibility of a hereditary predisposition to cancer, genetic testing, and to further clarify her future cancer risks, as well as potential cancer risks for family members.   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 This was treated with bilateral mastectomies.  Rachel Castaneda reports no additional personal history of cancer.   CANCER HISTORY:    Bilateral breast cancer ([place])   07/14/2015 Surgery Bilateral mastectomies: Left: IDC grade to 1.7 cm, with DCIS, LVI present, 0/5 lymph nodes negative, T1 cN0 stage IA, ER 100%, PR positive, HER-2 negative, Ki-67 15%; right: DCIS with comedonecrosis 1.3 cm, grade 2, Tis N0 stage 0, ER/PR 100%     HORMONAL RISK FACTORS:  Menarche was at age 59.  First live birth at age 35.  OCP use for approximately less than 1 year.  Ovaries intact: yes.  Hysterectomy: yes at age 43 for fibroids.  Menopausal status: postmenopausal.  HRT use: 0 years. Colonoscopy: yes, had her first and only colonoscopy in 2008, reports history of 1 polyp; due to have next colonoscopy in 2018 Mammogram within the last year:  yes. Number of breast biopsies: 2. Up to date with pelvic exams:  yes. Any excessive radiation exposure in the past:  no, but does report some secondhand smoke exposure (father smoked when she was a child)  Past Medical History  Diagnosis Date  . Varicose veins   . UTI (urinary tract infection)   . Other specified intestinal malabsorption ([place])   . Family history of anesthesia complication     daughter- extremely agitated   . Anxiety     pt. admits to anxiety, currently, not taking meds.   . Fall     related to dog attack- Nov. 11.2013  . Headache(784.0)     h/o migraines   . Anemia   . Hypertension     HTN- prior to gastric bypass, seen by cardiologist- stress test done, last one mid 1990's- pt. reports it was WNL.  Marland Kitchen HTN (hypertension)     no meds in 58yr  . Sleep apnea     used CPAP- prior to gastric bypass, no longer needs.  . Diabetes mellitus     Type II, prior to gastric bypass , off Metformin since 2008  . Arthritis     lumbar region  . Dysrhythmia     irreg heart beats    Past Surgical History  Procedure Laterality Date  . Hernia repair  1997  and   2001  . Cystectomy  2000    Sinus cyst  . Gastric bypass  2000  . Cholecystectomy  2000  Gall Bladder  . Colonoscopy  2008  . Abdominal hysterectomy  1989  . Carpal tunnel release      bilateral  . Tonsillectomy    . Cesarean section      x3  . Back surgery  2014    lumbar fusion  . Nasal sinus surgery    . Mastectomy w/ sentinel node biopsy Bilateral 07/14/2015    Procedure: BILATERAL TOTAL MASTECTOMIES WITH LEFT AXILLARY SENTINEL LYMPH NODE BIOPSY;  Surgeon: Rolm Bookbinder, MD;  Location: [facility];  Service: General;  Laterality: Bilateral;    Social History   Social History  . Marital Status: Divorced    Spouse Name: N/A  . Number of Children: N/A  . Years of Education: N/A   Social History Main Topics  . Smoking status: Never Smoker   . Smokeless tobacco: Never Used  .  Alcohol Use: Yes     Comment: extremely rarely  . Drug Use: No  . Sexual Activity: Yes    Birth Control/ Protection: Surgical   Other Topics Concern  . None   Social History Narrative     FAMILY HISTORY:  We obtained a detailed, 4-generation family history.  Significant diagnoses are listed below: Family History  Problem Relation Age of Onset  . Stroke Mother   . Hypertension Mother   . Skin cancer Mother     dx. multiple skin cancers  . Other Mother     hx of issues with abnormal vaginal bleeding - NOT treated with hysterectomy  . Ovarian cancer Maternal Aunt     dx 30s; s/p hysterectomy  . Diabetes Paternal Uncle   . Stroke Paternal Uncle   . Uterine cancer Maternal Grandmother     gyn cancer - maybe uterine, dx. 35s treated w/ radiation  . Bladder Cancer Maternal Grandmother 90  . Heart attack Paternal Grandmother   . Heart attack Paternal Grandfather   . Lupus Daughter   . Basal cell carcinoma Daughter      on cheek, dx. early 47s  . Squamous cell carcinoma Son     has had multiple - dx. mid-40s  . Ovarian cancer Maternal Aunt     lim info  . Stroke Maternal Aunt   . Breast cancer Cousin     paternal 1st cousin, once-removed d. early age    Rachel Castaneda has two daughters and one son, ages 2-45.  Her youngest daughter, Rachel Castaneda, has lupus.  Her oldest daughter has a history of basal cell carcinoma skin cancer on her cheek, diagnosed in her early 3s.  Her son has a history of multiple squamous cell carcinomas recently.  Rachel Castaneda has two grandsons.  She has one full sister and one full brother.  Her sister was passed away in a train accident at age 8.  Her brother is currently 83 and has never had cancer.  He has one son and daughter who are also cancer-free.  Rachel Castaneda mother passed away at 52.  She had a history of multiple skin cancers and a history of abnormal vaginal bleeding that was never treated with a hysterectomy.  Rachel Castaneda father passed away at 43,  which she attributes to kidney damaged sustained during [employer].    Rachel Castaneda mother had two full sisters and one maternal half-brother.  One sister is currently 85 and she has a history of ovarian cancer, diagnosed in her 5s and treated with a hysterectomy.  This sister has two sons who are cancer-free.  The other sister is perhaps about 5 years of age and has a history of a stroke--Ms. Olsson has limited information for this aunt.  She also has limited information for her mother's half-brother, but thinks he may still be living.  Her maternal grandmother died of bladder cancer at 21.  She also had a history of a gynecological cancer that was treated with radiation, which Ms. Beers believes may have been a uterine cancer.  This grandmother was an only child and her mother died at a young age following "lots of surgeries".  The great grandfather died of an ulcer or possibly a cancer of the stomach.  Ms. Fargnoli maternal grandfather passed away from an unspecified cause when her mother was only 33 years old.    Ms. Wadas father had seven full brothers and one full sister, most of whom passed away due to diabetes or stroke.  One uncle died of a gunshot wound and one from an accident, however.  Ms. Henkes reports no known history of cancer for these relatives or for their children.  She does report a history of an early death due to breast cancer for a paternal first cousins, once-removed.  Her paternal grandmother died of a heart attack in her mid-67s.  Her grandfather died of a heart attack at 83.  Ms. Palm has no further information for any paternal great aunts/uncles or great grandparents.    Ms. Mcgregory is unaware of any prior family history of genetic testing for hereditary cancer.  Patient's maternal ancestors are of [place], [place], and [place] descent, and paternal ancestors are of [place], [place], and [place] descent. There is no reported Ashkenazi Jewish ancestry. There is no  known consanguinity.  GENETIC COUNSELING ASSESSMENT: JERNEY BAKSH is a 70 y.o. female with a personal and family history of cancer which is somewhat suggestive of a hereditary cancer syndrome and predisposition to cancer. We, therefore, discussed and recommended the following at today's visit.   DISCUSSION: We reviewed the characteristics, features and inheritance patterns of hereditary cancer syndromes, particularly those caused by mutations within the BRCA1/2 and Lynch syndrome genes. We also discussed genetic testing, including the appropriate family members to test, the process of testing, insurance coverage and turn-around-time for results. We discussed the implications of a negative, positive and/or variant of uncertain significant result. We recommended Ms. Cassaro pursue genetic testing for the 32-gene Comprehensive Cancer Panel with MSH2 Exons 1-7 Inversion Analysis through [facility].  The Comprehensive Cancer Panel offered by [facility] includes sequencing and/or deletion duplication testing of the following 32 genes: APC, ATM, AXIN2, BARD1, BMPR1A, BRCA1, BRCA2, BRIP1, CDH1, CDK4, CDKN2A, CHEK2, EPCAM, FANCC, MLH1, MSH2, MSH6, MUTYH, NBN, PALB2, PMS2, POLD1, POLE, PTEN, RAD51C, RAD51D, SCG5/GREM1, SMAD4, STK11, TP53, VHL, and XRCC2.    Based on Ms. Goedken personal and family history of cancer, she meets medical criteria for genetic testing. Despite that she meets criteria, she may still have an out of pocket cost. We discussed that if her out of pocket cost for testing is over $100, the laboratory will call and confirm whether she wants to proceed with testing.  If the out of pocket cost of testing is less than $100 she will be billed by the genetic testing laboratory.   PLAN: After considering the risks, benefits, and limitations, Ms. Vorce  provided informed consent to pursue genetic testing and the blood sample was sent to [facility] for analysis of the 32-gene  Comprehensive Cancer Panel with MSH2 Exons 1-7 Inversion Analysis. Results  should be available within approximately 2-3 weeks' time, at which point they will be disclosed by telephone to Ms. Griffiths, as will any additional recommendations warranted by these results. Ms. Tom will receive a summary of her genetic counseling visit and a copy of her results once available. This information will also be available in Epic. We encouraged Ms. Segers to remain in contact with cancer genetics annually so that we can continuously update the family history and inform her of any changes in cancer genetics and testing that may be of benefit for her family. Ms. Wickstrom questions were answered to her satisfaction today. Our contact information was provided should additional questions or concerns arise.  Thank you for the referral and allowing [place] to share in the care of your patient.   Jeanine Luz, MS, [facility] Certified Genetic Counselor [email or web address] Phone: [phone]  The patient was seen for a total of 60 minutes in face-to-face genetic counseling.  This patient was discussed with Drs. Magrinat, Lindi Adie and/or Burr Medico who agrees with the above.    _______________________________________________________________________ For Office Staff:  Number of people involved in session: 2 Was an Intern/ student involved with case: no

## 2015-08-25 ENCOUNTER — Encounter (HOSPITAL_COMMUNITY): Payer: Self-pay

## 2015-08-25 ENCOUNTER — Telehealth: Payer: Self-pay | Admitting: *Deleted

## 2015-08-25 ENCOUNTER — Telehealth: Payer: Self-pay | Admitting: Adult Health

## 2015-08-25 DIAGNOSIS — C50911 Malignant neoplasm of unspecified site of right female breast: Secondary | ICD-10-CM

## 2015-08-25 DIAGNOSIS — C50912 Malignant neoplasm of unspecified site of left female breast: Principal | ICD-10-CM

## 2015-08-25 MED ORDER — ANASTROZOLE 1 MG PO TABS: 1.0000 mg | ORAL_TABLET | Freq: Every day | ORAL | Status: DC

## 2015-08-25 NOTE — Telephone Encounter (Signed)
appt made and letter sent by mail per 7/14 pof

## 2015-08-25 NOTE — Telephone Encounter (Signed)
  Oncology Nurse Navigator Documentation  Navigator Location: [facility] (08/25/15 1300) Navigator Encounter Type: Telephone (08/25/15 1300) Telephone: Outgoing Call;Diagnostic Results (08/25/15 1300)                 Interventions: Referrals (08/25/15 1300) Referrals: Survivorship (08/25/15 1300)                    Time Spent with Patient: 15 (08/25/15 1300)   Spoke with patient to giver her oncotype results.  She is happy she does not need chemo.  Called in anastrozole to her pharmacy and placed referral to survivorship.

## 2015-08-25 NOTE — Telephone Encounter (Signed)
Received Oncotype Dx score of 17/11%.  Placed a copy on Dr. Geralyn Flash desk.  [place] Getting a copy and took a copy to HIM to scan.

## 2015-09-15 DIAGNOSIS — Z1211 Encounter for screening for malignant neoplasm of colon: Secondary | ICD-10-CM | POA: Diagnosis not present

## 2015-09-15 DIAGNOSIS — Z1212 Encounter for screening for malignant neoplasm of rectum: Secondary | ICD-10-CM | POA: Diagnosis not present

## 2015-09-17 ENCOUNTER — Telehealth: Payer: Self-pay | Admitting: Genetic Counselor

## 2015-09-17 NOTE — Telephone Encounter (Signed)
Discussed with Ms. Cerreta that her genetic test results were negative for pathogenic mutations within any of 32 genes on the Comprehensive Cancer Panel through [facility].  Additionally, no uncertain changes were found.  Discussed that this seems to be reassuring that her cancers were sporadic.  Discussed that other family members on her mother's side could still have testing due to her aunt's history of ovarian cancer.  Ms. Adelson should continue to follow her doctors' recommendations for cancer screening.  Her daughters should begin annual mammograms at the age of 73, or earlier as directed by their doctor.  Ms. Hlinka is welcome to call with any questions.  I will email her a copy of her results.

## 2015-09-18 ENCOUNTER — Ambulatory Visit: Payer: Self-pay | Admitting: Genetic Counselor

## 2015-09-18 DIAGNOSIS — Z8041 Family history of malignant neoplasm of ovary: Secondary | ICD-10-CM

## 2015-09-18 DIAGNOSIS — Z1379 Encounter for other screening for genetic and chromosomal anomalies: Secondary | ICD-10-CM

## 2015-09-18 DIAGNOSIS — Z8052 Family history of malignant neoplasm of bladder: Secondary | ICD-10-CM

## 2015-09-18 DIAGNOSIS — C50811 Malignant neoplasm of overlapping sites of right female breast: Secondary | ICD-10-CM

## 2015-09-18 DIAGNOSIS — C50812 Malignant neoplasm of overlapping sites of left female breast: Secondary | ICD-10-CM

## 2015-09-18 DIAGNOSIS — Z8049 Family history of malignant neoplasm of other genital organs: Secondary | ICD-10-CM

## 2015-10-14 ENCOUNTER — Ambulatory Visit: Payer: Medicare Other

## 2015-10-27 ENCOUNTER — Encounter: Payer: Self-pay | Admitting: Adult Health

## 2015-10-27 ENCOUNTER — Ambulatory Visit (HOSPITAL_BASED_OUTPATIENT_CLINIC_OR_DEPARTMENT_OTHER): Payer: Medicare Other | Admitting: Adult Health

## 2015-10-27 VITALS — BP 144/90 | HR 97 | Temp 98.8°F | Resp 19 | Wt 225.9 lb

## 2015-10-27 DIAGNOSIS — Z170 Estrogen receptor positive status [ER+]: Secondary | ICD-10-CM | POA: Diagnosis not present

## 2015-10-27 DIAGNOSIS — C50911 Malignant neoplasm of unspecified site of right female breast: Secondary | ICD-10-CM | POA: Diagnosis not present

## 2015-10-27 DIAGNOSIS — R635 Abnormal weight gain: Secondary | ICD-10-CM | POA: Diagnosis not present

## 2015-10-27 DIAGNOSIS — Z780 Asymptomatic menopausal state: Secondary | ICD-10-CM

## 2015-10-27 DIAGNOSIS — C50912 Malignant neoplasm of unspecified site of left female breast: Secondary | ICD-10-CM | POA: Diagnosis not present

## 2015-10-27 DIAGNOSIS — Z79811 Long term (current) use of aromatase inhibitors: Secondary | ICD-10-CM

## 2015-10-27 NOTE — Progress Notes (Signed)
CLINIC:  Survivorship   REASON FOR VISIT:  Routine follow-up post-treatment for a recent history of breast cancer.  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  Tis N0 stage 0, ER/PR 100%      07/14/2015 Oncotype testing    Recurrence score: 17; ROR 11% (low-risk)      08/21/2015 Procedure    Genetic testing: Negative. Genes analyzed: APC, ATM, AXIN2, BARD1, BMPR1A, BRCA1, BRCA2, BRIP1, CDH1, CDK4, CDKN2A, CHEK2, EPCAM, FANCC, MLH1, MSH2, MSH6, MUTYH, NBN, PALB2, PMS2, POLD1, POLE, PTEN, RAD51C, RAD51D, SCG5/GREM1, SMAD4, STK11, TP53, VHL, and XRCC2.        08/2015 -  Anti-estrogen oral therapy    Anastrazole 1 mg daily. Planned duration of therapy: 5-10 years.        INTERVAL HISTORY:  Rachel Castaneda presents to the Survivorship Clinic today for our initial meeting to review her survivorship care plan detailing her treatment course for breast cancer, as well as monitoring long-term side effects of that treatment, education regarding health maintenance, screening, and overall wellness and health promotion.     Overall, Rachel Castaneda reports feeling quite well since completing her bilateral mastectomy surgery about 3 months ago.  She continues to have some left chest wall edema/tissue fullness since surgery; both Dr. Lindi Adie and Dr. Donne Hazel have reassured her that this will likely resolve more fully as she continues to heal.  She started on the anastrozole in 08/2015 and is tolerating it very well thus far.  She has occasional hot flashes, but she feels they are actually getting better since when  she first started the Arimidex.  She had some nausea at initiation of therapy as well, which is also improving; she started taking Pepcid BID which is helping her nausea/reflux symptoms.  She has varicose veins in bilateral legs and is concerned that this along with the anastrozole will make her at increased risk of blood clots.  She has several questions about interactions with herbal topical remedies she is using to help with other symptoms. She is discouraged about weight gain and would like some resources to help with this.  She is concerned about her diet including mostly soy products, as she is a vegan and does not eat meat or dairy products.  She wants to know if the soy will increase her risk of breast cancer recurrence.    Her husband is present in clinic with her today. He is currently undergoing treatment for prostate cancer with daily radiation therapy.    REVIEW OF SYSTEMS:  Review of Systems  Constitutional: Positive for malaise/fatigue.       Some fatigue  HENT: Negative.   Respiratory: Negative.   Gastrointestinal: Positive for heartburn and nausea. Negative for constipation, diarrhea and vomiting.  Genitourinary: Negative.   Musculoskeletal: Positive for back pain.       Chronic back pain (h/o spinal surgeries)  Skin:       Concerned about varicose veins on legs  GU: Denies vaginal bleeding, discharge, or dryness.  Breast: Denies any new nodularity, masses, tenderness, nipple changes, or nipple discharge.    A  14-point review of systems was completed and was negative, except as noted above.   ONCOLOGY TREATMENT TEAM:  1. Surgeon:  Dr. Donne [facility]el at [facility] 2. Medical Oncologist: Dr. Lindi Adie     PAST MEDICAL/SURGICAL HISTORY:  Past Medical History:  Diagnosis Date  . Anemia   . Anxiety    pt. admits to anxiety, currently, not taking meds.   . Arthritis    lumbar region  . Diabetes mellitus    Type II, prior to gastric bypass , off Metformin since  2008  . Dysrhythmia    irreg heart beats  . Fall    related to dog attack- Nov. 11.2013  . Family history of anesthesia complication    daughter- extremely agitated   . Headache(784.0)    h/o migraines   . HTN (hypertension)    no meds in 48yr  . Hypertension    HTN- prior to gastric bypass, seen by cardiologist- stress test done, last one mid 1990's- pt. reports it was WNL.  .Marland KitchenOther specified intestinal malabsorption ([place]   . Sleep apnea    used CPAP- prior to gastric bypass, no longer needs.  .Marland KitchenUTI (urinary tract infection)   . Varicose veins    Past Surgical History:  Procedure Laterality Date  . ABDOMINAL HYSTERECTOMY  1989  . BACK SURGERY  2014   lumbar fusion  . CARPAL TUNNEL RELEASE     bilateral  . CESAREAN SECTION     x3  . CHOLECYSTECTOMY  2000   Gall Bladder  . COLONOSCOPY  2008  . CYSTECTOMY  2000   Sinus cyst  . GASTRIC BYPASS  2000  . [place] and   2001  . MASTECTOMY W/ SENTINEL NODE BIOPSY Bilateral 07/14/2015   Procedure: BILATERAL TOTAL MASTECTOMIES WITH LEFT AXILLARY SENTINEL LYMPH NODE BIOPSY;  Surgeon: MRolm Bookbinder MD;  [facility]  Service: General;  Laterality: Bilateral;  . NASAL SINUS SURGERY    . TONSILLECTOMY       ALLERGIES:  Allergies  Allergen Reactions  . Codeine Hives  . Baclofen Other (See Comments)    Nightmares   . [employer] [[employer] rash  . Gabapentin     Weight gain/ severe depression and memory loss  . Ibuprofen Itching and Swelling  . Topamax [Topiramate]     Depressed, fuzzy feeling  . Adhesive [Tape] Rash    Redness/   . Nsaids Rash  . Tramadol Rash     CURRENT MEDICATIONS:  Outpatient Encounter Prescriptions as of 10/27/2015  Medication Sig Note  . famotidine (PEPCID) 10 MG tablet Take 10 mg by mouth 3 times/day as needed-between meals & bedtime for heartburn or indigestion.   .Marland Kitchenacetaminophen (TYLENOL) 500 MG tablet Take 1,000 mg by mouth 3 (three) times  daily. 10/27/2015: [facility] Take 1,000 mg by mouth every 6 (six) hours as needed.  . ALPHA LIPOIC ACID PO Take 100 mg by mouth daily.   .Marland Kitchenanastrozole (ARIMIDEX) 1 MG tablet Take 1 tablet (1 mg total) by mouth daily.   .Marland Kitchenaspirin 81 MG tablet Take 81 mg by mouth daily. Reported on 08/14/2015   . Biotin 1000 MCG tablet Take 1,000 mcg by mouth daily.    . calcium carbonate (TUMS EX) 750 MG chewable tablet Chew by mouth daily as needed. 10/27/2015: [facility].  .Marland Kitchen  cholecalciferol (VITAMIN D) 1000 UNITS tablet Take 1,000 Units by mouth daily.   . Cyanocobalamin (VITAMIN B 12 PO) Take 2,500 mcg by mouth once a week.    . ferrous sulfate 325 (65 FE) MG EC tablet Take 65 mg by mouth daily with breakfast.    . metroNIDAZOLE (METROGEL) 0.75 % gel Apply 1 application topically daily.   . Multiple Vitamin (MULTIVITAMIN) tablet Take 1 tablet by mouth daily.   Marland Kitchen nystatin (MYCOSTATIN/NYSTOP) powder Apply topically daily as needed. 10/27/2015: Received from: External Pharmacy  . nystatin cream (MYCOSTATIN) Apply topically daily as needed. 10/27/2015: Received from: External Pharmacy  . Potassium 99 MG TABS Take 99 mg by mouth daily.     No facility-administered encounter medications on file as of 10/27/2015.      ONCOLOGIC FAMILY HISTORY:  Family History  Problem Relation Age of Onset  . Stroke Mother   . Hypertension Mother   . Skin cancer Mother     dx. multiple skin cancers  . Other Mother     hx of issues with abnormal vaginal bleeding - NOT treated with hysterectomy  . Ovarian cancer Maternal Aunt     dx 37s; s/p hysterectomy  . Diabetes Paternal Uncle   . Stroke Paternal Uncle   . Uterine cancer Maternal Grandmother     gyn cancer - maybe uterine, dx. 40s treated w/ radiation  . Bladder Cancer Maternal Grandmother 90  . Heart attack Paternal Grandmother   . Heart attack Paternal  Grandfather   . Lupus Daughter   . Basal cell carcinoma Daughter      on cheek, dx. early 47s  . Squamous cell carcinoma Son     has had multiple - dx. mid-40s  . Ovarian cancer Maternal Aunt     lim info  . Stroke Maternal Aunt   . Breast cancer Cousin     paternal 1st cousin, once-removed d. early age     GENETIC COUNSELING/TESTING: 08/21/15-Genetic testing: Negative. Genes analyzed: APC, ATM, AXIN2, BARD1, BMPR1A, BRCA1, BRCA2, BRIP1, CDH1, CDK4, CDKN2A, CHEK2, EPCAM, FANCC, MLH1, MSH2, MSH6, MUTYH, NBN, PALB2, PMS2, POLD1, POLE, PTEN, RAD51C, RAD51D, SCG5/GREM1, SMAD4, STK11, TP53, VHL, and XRCC2.    SOCIAL HISTORY:  AERILYN SLEE is married and lives with her husband in [place], [place].  They have 2 adult daughters. One of their daughters is a physician (in her pathology residency now).  Rachel Castaneda denies any current tobacco, alcohol, or illicit drug use.     PHYSICAL EXAMINATION:  Vital Signs:   Vitals:   10/27/15 1058  BP: (!) 144/90  Pulse: 97  Resp: 19  Temp: 98.8 F (37.1 C)   Filed Weights   10/27/15 1058  Weight: 225 lb 14.4 oz (102.5 kg)   General: Well-nourished, well-appearing female in no acute distress.  She is accompanied by her husband today.   HEENT: Head is normocephalic.  Pupils equal and reactive to light. Conjunctivae clear without exudate.  Sclerae anicteric. Oral mucosa is pink, moist.  Oropharynx is pink without lesions or erythema.  Lymph: No cervical, supraclavicular, or infraclavicular lymphadenopathy noted on palpation.  Cardiovascular: Regular rate and rhythm.Marland Kitchen Respiratory: Clear to auscultation bilaterally. Chest expansion symmetric; breathing non-labored.  Chest wall:  -Left chest wall (lateral) with mild edema when compared to the right lateral chest wall. Well-healed mastectomy scars bilaterally.  GI: Abdomen soft and round; non-tender, non-distended. Bowel sounds normoactive.  GU: Deferred.  Neuro: No focal deficits. Steady gait.    Psych: Mood and affect  normal and appropriate for situation.  Extremities: No edema. Skin: Warm and dry.  LABORATORY DATA:  None for this visit.  DIAGNOSTIC IMAGING:  None for this visit.      ASSESSMENT AND PLAN:  Ms.. Castaneda is a pleasant 70 y.o. female with bilateral breast cancers; Stage IA left breast invasive ductal carcinoma, ER+/PR+/HER2-, and Stage 0 right breast DCIS, diagnosed in 06/2015, treated with bilateral mastectomies without reconstruction.  She started anti-estrogen therapy with Anastrozole in 08/2015; planned duration of therapy 5-10 years.  She presents to the Survivorship Clinic for our initial meeting and routine follow-up post-completion of treatment for breast cancer.    1. Bilateral breast cancer (Stage IA left & Stage 0 right):  Rachel Castaneda is continuing to recover from definitive treatment for breast cancer.  She has not seen Dr. Lindi Adie in several months, so I will place an order to have him see her sometime in 11/2015 for follow-up.  She will continue her anti-estrogen therapy with anastrozole. Thus far, she is tolerating the the medication well, with minimal side effects.  We discussed that at 3 months, we are more aware of side effects women will experience and to what severity. It is very encouraging that she is tolerating the anastrozole very well going into her 3rd month of therapy.  We discussed and I provided reassurance/reinforcement from previous discussions Dr. Lindi Adie has had with the patient, that the risk of blood clots even with her history of varicose veins, is very small.  We will continue to monitor her closely while on anti-estrogen therapy and I encouraged her to call us with any new/concerning symptoms.  Today, a comprehensive survivorship care plan and treatment summary was reviewed with the patient today detailing her breast cancer diagnosis, treatment course, potential late/long-term effects of treatment, appropriate follow-up care with  recommendations for the future, and patient education resources.  A copy of this summary, along with a letter will be sent to the patient's primary care provider via mail/fax/In Basket message after today's visit.    2. Weight gain: Rachel Castaneda is concerned about recent weight gain, particularly since starting anti-estrogen therapy.  We discussed that while anastrozole does not directly 'cause' weight gain, it does affect estrogen levels which makes weight loss difficult.  However, this is also true of menopause.  I discussed that we have a research study right now called BWEL, which helps women who are overweight with nutrition/exercise support. Unfortunately, she does not qualify as she did not have node-positive disease.  Instead, we discussed the [employer] fitness program, which is designed for cancer survivors to help them become more physically fit after cancer treatments.   I encouraged the patient and her husband to consider participating together; they would just need to wait until her husband finishes all of his treatment before getting registered to participate.  I think this would be a great way for the patient to receive some expert instruction and to work with her physical limitations as they relate to her h/o spinal disease/surgeries.    3. Herbal topical remedies/Soy products:  She is using a St. Johns's Wort-containing ointment on her breast tissue, which is helping her healing.  She showed the bottle to me today and it contains several other benign ingredients like vitamin E, aloe vera, beeswax, etc. I looked up the interactions between oral St. John's Wort (which of course she is using the ointment) and anastrozole, which showed no interactions.  She also uses "CBD oil", which is a cannibis  oil derivative; this is very helpful in managing her back pain.  I think these are safe for her to continue, as it relates to her history of breast cancer and current anti-estrogen therapy.  We  discussed that there is little data to suggest that diets rich in soy-products increase risk of breast cancer; however, taking soy supplements can be more risky, which Rachel Castaneda is not doing.  We discussed that I would rather her get the nutrients/vitamins/minerals from her food than from supplements; we do not always know what percentage of a particular nutrient is in the oral medications/supplements, making them inherently more risky.  Given that she is vegan, has had gastric bypass surgery, and relies on soy products to get adequate calories/nutrients, this is my preference rather than malnutrition obviously.  She has requested a referral to our dietitian and I have placed that referral today.    4. GERD symptoms, improving: Encouraged her to continue to use the Pepcid as directed. This is controlling her symptoms well.  If this stops working, then she could use OTC Prilosec as an alternative.  The GERD may be secondary to her gastric bypass, but anecdotally have seen several patients who have some reflux symptoms and nausea when starting aromatase inhibitors. Her current regiment controls her symptoms well, which is great.   5. Bone health:  Given Rachel Castaneda age, history of breast cancer, and her current treatment regimen including anti-estrogen therapy with anastrozole, she is at risk for bone demineralization.  We do not have a baseline DEXA scan. Therefore, orders placed today.  I will have her go to the [facility] to have this done.  In the meantime, she was encouraged to increase her consumption of foods rich in calcium, as well as increase her weight-bearing activities.   6. Cancer screening:  Due to Rachel Castaneda history and her age, she should receive screening for skin cancers, colon cancer, and gynecologic cancers.  The information and recommendations are listed on the patient's comprehensive care plan/treatment summary and were reviewed in detail with the patient.    7. Health  maintenance and wellness promotion: Rachel Castaneda was encouraged to consume 5-7 servings of fruits and vegetables per day. We reviewed the "Nutrition Rainbow" handout, as well as the handout "Take Control of Your Health and Reduce Your Cancer Risk" from the [facility].  She was also encouraged to engage in moderate to vigorous exercise for 30 minutes per day most days of the week.  She was instructed to limit her alcohol consumption and continue to abstain from tobacco use.   8. Support services/counseling: It is not uncommon for this period of the patient's cancer care trajectory to be one of many emotions and stressors.  We discussed an opportunity for her to participate in the next session of [facility] ("Finding Your New Normal") support group series designed for patients after they have completed treatment.   Rachel Castaneda was encouraged to take advantage of our many other support services programs, support groups, and/or counseling in coping with her new life as a cancer survivor after completing anti-cancer treatment.  She was offered support today through active listening and expressive supportive counseling.  She was given information regarding our available services and encouraged to contact me with any questions or for help enrolling in any of our support group/programs.    Dispo:   -Return to cancer center to see Dr. Lindi Adie in 11/2015.  -She is welcome to return back to the Survivorship Clinic at any  time; no additional follow-up needed at this time.  -Consider referral back to survivorship as a long-term survivor for continued surveillance  A total of 65 minutes of face-to-face time was spent with this patient with greater than 50% of that time in counseling and care-coordination.   Mike Craze, NP Survivorship Program [place] [phone]   Note: PRIMARY CARE PROVIDER [facility] [place] [phone]

## 2015-10-29 ENCOUNTER — Telehealth: Payer: Self-pay | Admitting: Adult Health

## 2015-10-29 NOTE — Telephone Encounter (Signed)
Patient called me with questions regarding her upcoming DEXA scan, scheduled for 11/03/15 at the [facility].  She tells me that she was told to bring a signed doctor's order for the scan along with her medication list and wanted to know if she needed to come by the cancer center to get an order for the scan.   I let her know that the orders were placed in EPIC and they have access to the orders in our system; therefore she does not need to worry about taking a signed order form with her to the appointment.  I did encourage her to bring her medicine list, as it will be important for the imaging staff to do a medication reconciliation at her visit.  She is aware of her instructions for the scan as well (no calcium supplements before scan, etc). She voiced understanding and appreciation.  Encouraged her to call me with any other questions/concerns.   Mike Craze, NP [place] [phone]

## 2015-11-03 ENCOUNTER — Telehealth: Payer: Self-pay | Admitting: Adult Health

## 2015-11-03 ENCOUNTER — Encounter: Payer: Self-pay | Admitting: Genetic Counselor

## 2015-11-03 ENCOUNTER — Ambulatory Visit
Admission: RE | Admit: 2015-11-03 | Discharge: 2015-11-03 | Disposition: A | Discharge status: Home / Self Care | Payer: Medicare Other | Source: Ambulatory Visit | Attending: Adult Health | Admitting: Adult Health

## 2015-11-03 DIAGNOSIS — Z780 Asymptomatic menopausal state: Secondary | ICD-10-CM | POA: Diagnosis not present

## 2015-11-03 DIAGNOSIS — C50912 Malignant neoplasm of unspecified site of left female breast: Secondary | ICD-10-CM | POA: Diagnosis not present

## 2015-11-03 DIAGNOSIS — M85852 Other specified disorders of bone density and structure, left thigh: Secondary | ICD-10-CM | POA: Diagnosis not present

## 2015-11-03 DIAGNOSIS — Z1379 Encounter for other screening for genetic and chromosomal anomalies: Secondary | ICD-10-CM | POA: Insufficient documentation

## 2015-11-03 DIAGNOSIS — Z79811 Long term (current) use of aromatase inhibitors: Secondary | ICD-10-CM

## 2015-11-03 DIAGNOSIS — C50911 Malignant neoplasm of unspecified site of right female breast: Secondary | ICD-10-CM | POA: Diagnosis not present

## 2015-11-03 NOTE — Telephone Encounter (Signed)
Spoke with patient to give her the results of her DEXA scan, which showed osteopenia.   Recommended she increase her calcium to 1200 mg/day and her vitamin D to 600-800 IU/day.  Also encouraged her to increase weight-bearing exercise as tolerated.    Patient voiced understanding. Encouraged her to call me with any other questions/concerns.   Mike Craze, NP [place] [phone]

## 2015-11-03 NOTE — Progress Notes (Signed)
GENETIC TEST RESULT  HPI: [name] was previously seen in the [place] clinic due to a personal history of bilateral breast cancer, family history of ovarian, uterine, and other cancers, and concerns regarding a hereditary predisposition to cancer. Please refer to our prior cancer genetics clinic note from August 21, 2015 for more information regarding [name] medical, social and family histories, and our assessment and recommendations, at the time. [name] recent genetic test results were disclosed to her, as were recommendations warranted by these results. These results and recommendations are discussed in more detail below.  GENETIC TEST RESULTS: At the time of [name] visit on 08/21/15, we recommended she pursue genetic testing of the 32-gene Comprehensive Cancer Panel with MSH2 Exons 1-7 Inversion Analysis through [employer]. The Comprehensive Cancer Panel offered by [facility] Junius Roads, MD) includes sequencing and/or deletion duplication testing of the following 32 genes: APC, ATM, AXIN2, BARD1, BMPR1A, BRCA1, BRCA2, BRIP1, CDH1, CDK4, CDKN2A, CHEK2, EPCAM, FANCC, MLH1, MSH2, MSH6, MUTYH, NBN, PALB2, PMS2, POLD1, POLE, PTEN, RAD51C, RAD51D, SCG5/GREM1, SMAD4, STK11, TP53, VHL, and XRCC2.  Those results are now back, the report date for which is September 16, 2015.  Genetic testing was normal, and did not reveal a deleterious mutation in these genes.  Additionally, no variants of uncertain significance (VUSes) were found.  The test report will be scanned into EPIC and will be located under the Results Review tab in the Pathology>Molecular Pathology section.   We discussed with [name] that since the current genetic testing is not perfect, it is possible there may be a gene mutation in one of these genes that current testing cannot detect, but that chance is small. We also discussed, that it is possible that another gene that has not yet been  discovered, or that we have not yet tested, is responsible for the cancer diagnoses in the family, and it is, therefore, important to remain in touch with cancer genetics in the future so that we can continue to offer [name] the most up-to-date genetic testing.     CANCER SCREENING RECOMMENDATIONS: This result is reassuring and indicates that [name] likely does not have an increased risk for a future cancer due to a mutation in one of these genes. This normal test also suggests that [name] cancer was most likely not due to an inherited predisposition associated with one of these genes.  Most cancers happen by chance and this negative test suggests that her cancer falls into this category.  We, therefore, recommended she continue to follow the cancer management and screening guidelines provided by her oncology and primary healthcare providers.   RECOMMENDATIONS FOR FAMILY MEMBERS: Women in this family might be at some increased risk of developing cancer, over the general population risk, simply due to the family history of cancer. We recommended women in this family have a yearly mammogram beginning at age 60, or 63 years younger than the earliest onset of cancer, an annual clinical breast exam, and perform monthly breast self-exams. Women in this family should also have a gynecological exam as recommended by their primary provider. All family members should have a colonoscopy by age 38.  Based on [name] family history of ovarian cancer (in her maternal aunt), we recommended other interested maternal relatives have genetic counseling and testing. [name] will let us know if we can be of any assistance in coordinating genetic counseling and/or testing for these family members.   FOLLOW-UP: Lastly, we discussed  with [name] that cancer genetics is a rapidly advancing field and it is possible that new genetic tests will be appropriate for her and/or her family members in the  future. We encouraged her to remain in contact with cancer genetics on an annual basis so we can update her personal and family histories and let her know of advances in cancer genetics that may benefit this family.   Our contact number was provided. [name] questions were answered to her satisfaction, and she knows she is welcome to call us at anytime with additional questions or concerns.   Jeanine Luz, MS, [facility] Certified Genetic Counselor [email or web address] Phone: [phone]

## 2015-11-11 ENCOUNTER — Ambulatory Visit: Payer: Medicare Other | Admitting: Nutrition

## 2015-11-11 NOTE — Progress Notes (Signed)
70 year old female diagnosed with breast cancer in May 2017, status post double mastectomy.  She is a patient of Dr. Lindi Adie.  Past medical history includes anemia, anxiety, diabetes, Roux-en-Y gastric bypass in 2000, hypertension, and malabsorption.  Medications include Arimidex, biotin, calcium carbonate, vitamin D, vitamin B12, ferrous sulfate, multivitamin.  Labs include glucose 100  Height: 5 feet 3 inches. Weight: 225 pounds. BMI: 40.02.  Patient reports her highest weight was 330 pounds and her lowest weight was 185 pounds. Patient is interested in information on weight loss, anti-inflammatory diet, and breast cancer prevention. Patient reports she is a vegan and has questions about soy intake. Patient avoids many foods because they trigger migranes. Verbalizes distress at current weight.  Does not understand why she is overweight because she states she does not eat very much. Patient has many reasons why she cannot change or add foods to her diet and why she cannot add exercise to her lifestyle.  She refuses to keep a food journal (states she has done that before) but does not know how many calories she eats daily. Patient appears not to be ready to make lifestyle changes.  Nutrition diagnosis: Food and nutrition related knowledge deficit related to breast cancer as evidenced by no prior need for nutrition related information.  Intervention: Educated patient that today we will be focusing on foods to eat to reduce breast cancer recurrence. Encouraged patient to consume a plant-based diet primarily focused on increasing fruits and vegetables as tolerated. Reviewed the importance of consuming soy foods for additional protein. Encouraged increased activity as tolerated. Fact sheets were provided.  Questions were answered.  Teach back method used.  Contact information was given.  Monitoring, evaluation, goals:  Patient will tolerate plant-based diet with adequate calories to reduce  risk for breast cancer recurrence.  Referral made to a dietitian in the community to better address patient's multiple questions regarding anti-inflammatory diet, limiting nightshade vegetables, foods to avoid secondary to migraines as well as weight loss strategies.

## 2015-11-21 DIAGNOSIS — Z23 Encounter for immunization: Secondary | ICD-10-CM | POA: Diagnosis not present

## 2015-12-01 ENCOUNTER — Encounter: Payer: Self-pay | Admitting: Hematology and Oncology

## 2015-12-01 ENCOUNTER — Ambulatory Visit: Payer: Medicare Other | Admitting: Hematology and Oncology

## 2015-12-01 ENCOUNTER — Ambulatory Visit (HOSPITAL_BASED_OUTPATIENT_CLINIC_OR_DEPARTMENT_OTHER): Payer: Medicare Other | Admitting: Hematology and Oncology

## 2015-12-01 DIAGNOSIS — C50812 Malignant neoplasm of overlapping sites of left female breast: Secondary | ICD-10-CM

## 2015-12-01 DIAGNOSIS — Z170 Estrogen receptor positive status [ER+]: Secondary | ICD-10-CM | POA: Diagnosis not present

## 2015-12-01 DIAGNOSIS — C50811 Malignant neoplasm of overlapping sites of right female breast: Secondary | ICD-10-CM | POA: Diagnosis not present

## 2015-12-01 DIAGNOSIS — N951 Menopausal and female climacteric states: Secondary | ICD-10-CM

## 2015-12-01 NOTE — Progress Notes (Signed)
Patient Care Team: Ernestene Kiel, MD as PCP - General (Internal Medicine)  DIAGNOSIS:  Encounter Diagnosis  Name Primary?  . 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  Tis N0 stage 0, ER/PR 100%      07/14/2015 Oncotype testing    Recurrence score: 17; ROR 11% (low-risk)      08/21/2015 Procedure    Genetic testing: Negative. Genes analyzed: APC, ATM, AXIN2, BARD1, BMPR1A, BRCA1, BRCA2, BRIP1, CDH1, CDK4, CDKN2A, CHEK2, EPCAM, FANCC, MLH1, MSH2, MSH6, MUTYH, NBN, PALB2, PMS2, POLD1, POLE, PTEN, RAD51C, RAD51D, SCG5/GREM1, SMAD4, STK11, TP53, VHL, and XRCC2.        08/26/2015 -  Anti-estrogen oral therapy    Anastrazole 1 mg daily. Planned duration of therapy: 5-10 years.        CHIEF COMPLIANT: Follow-up on anastrozole  INTERVAL HISTORY: Rachel Castaneda is a 70 year old with above-mentioned history of bilateral mastectomies some with low risk Oncotype DX score. She is currently on anastrozole therapy. She appears to be tolerating it extremely well. She initially had nausea and hot flashes both of which have resolved. She does not have any further problems with either. She denies any myalgias other than her chronic low back pains  REVIEW OF SYSTEMS:   Constitutional: Denies fevers, chills or abnormal weight loss Eyes: Denies blurriness of vision Ears, nose, mouth, throat, and face: Denies mucositis or sore throat Respiratory: Denies cough, dyspnea or wheezes Cardiovascular:  Denies palpitation, chest discomfort Gastrointestinal:  Denies nausea, heartburn or change in bowel habits Skin: Denies abnormal skin rashes Lymphatics: Denies new lymphadenopathy or easy bruising Neurological:Denies numbness, tingling or new weaknesses Behavioral/Psych: Mood is stable, no new changes  Extremities: No lower extremity edema Breast:  denies any pain or lumps or nodules in either breasts All other systems were reviewed with the patient and are negative.  I have reviewed the past medical history, past surgical history, social history and family history with the patient and they are unchanged from previous note.  ALLERGIES:  is allergic to codeine; baclofen; dairy aid [lactase]; gabapentin; ibuprofen; topamax [topiramate]; adhesive [tape]; nsaids; and tramadol.  MEDICATIONS:  Current Outpatient Prescriptions  Medication Sig Dispense Refill  . acetaminophen (TYLENOL) 500 MG tablet Take 1,000 mg by mouth 3 (three) times daily.    . ALPHA LIPOIC ACID PO Take 100 mg by mouth daily.    Marland Kitchen anastrozole (ARIMIDEX) 1 MG tablet Take 1 tablet (1 mg total) by mouth daily. 30 tablet 4  . aspirin 81 MG tablet Take 81 mg by mouth daily. Reported on 08/14/2015    . Biotin 1000 MCG tablet Take 1,000 mcg by mouth daily.     . calcium carbonate (TUMS EX) 750 MG chewable tablet Chew by mouth daily as needed.    . cholecalciferol (VITAMIN D) 1000 UNITS tablet Take 1,000 Units by mouth daily.    . Cyanocobalamin (VITAMIN B 12 PO) Take 2,500 mcg by mouth once a week.     . famotidine (PEPCID) 10 MG  tablet Take 10 mg by mouth 3 times/day as needed-between meals & bedtime for heartburn or indigestion.    . ferrous sulfate 325 (65 FE) MG EC tablet Take 65 mg by mouth daily with breakfast.     . metroNIDAZOLE (METROGEL) 0.75 % gel Apply 1 application topically daily.    . Multiple Vitamin (MULTIVITAMIN) tablet Take 1 tablet by mouth daily.    Marland Kitchen nystatin (MYCOSTATIN/NYSTOP) powder Apply topically daily as  needed.  98  . nystatin cream (MYCOSTATIN) Apply topically daily as needed.  98  . Potassium 99 MG TABS Take 99 mg by mouth daily.      No current facility-administered medications for this visit.     PHYSICAL EXAMINATION: ECOG PERFORMANCE STATUS: 1 - Symptomatic but completely ambulatory  Vitals:   12/01/15 1350  BP: 138/76  Pulse: 85  Resp: 18  Temp: 98 F (36.7 C)   Filed Weights   12/01/15 1350  Weight: 216 lb 1.6 oz (98 kg)    GENERAL:alert, no distress and comfortable SKIN: skin color, texture, turgor are normal, no rashes or significant lesions EYES: normal, Conjunctiva are pink and non-injected, sclera clear OROPHARYNX:no exudate, no erythema and lips, buccal mucosa, and tongue normal  NECK: supple, thyroid normal size, non-tender, without nodularity LYMPH:  no palpable lymphadenopathy in the cervical, axillary or inguinal LUNGS: clear to auscultation and percussion with normal breathing effort HEART: regular rate & rhythm and no murmurs and no lower extremity edema ABDOMEN:abdomen soft, non-tender and normal bowel sounds MUSCULOSKELETAL:no cyanosis of digits and no clubbing  NEURO: alert & oriented x 3 with fluent speech, no focal motor/sensory deficits EXTREMITIES: No lower extremity edema  LABORATORY DATA:  I have reviewed the data as listed   Chemistry      Component Value Date/Time   NA 142 07/10/2015 1013   K 3.5 07/10/2015 1013   CL 105 07/10/2015 1013   CO2 29 07/10/2015 1013   BUN 11 07/10/2015 1013   CREATININE 0.60 07/10/2015 1013      Component Value Date/Time   CALCIUM 9.1 07/10/2015 1013   ALKPHOS 86 07/10/2015 1013   AST 21 07/10/2015 1013   ALT 16 07/10/2015 1013   BILITOT 0.6 07/10/2015 1013       Lab Results  Component Value Date   WBC 11.1 (H) 07/15/2015   HGB 11.3 (L) 07/15/2015   HCT 34.8 (L) 07/15/2015   MCV 94.8 07/15/2015   PLT 191 07/15/2015   NEUTROABS 4.1 07/10/2015     ASSESSMENT & PLAN:  Bilateral breast cancer  ([facility]) Bilateral mastectomies 07/14/2015: Left: IDC grade to 1.7 cm, with DCIS, LVI present, 0/5 lymph nodes negative, T1 cN0 stage IA, ER 100%, PR positive, HER-2 negative, Ki-67 15%; right: 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 Mild hot flashes 2. initially mild nausea which has resolved  Return to clinic in 6 months for follow-up   No orders of the defined types were placed in this encounter.  The patient has a good understanding of the overall plan. she agrees with it. she will call with any problems that may develop before the next visit here.   Rulon Eisenmenger, MD 12/01/15

## 2015-12-01 NOTE — Assessment & Plan Note (Signed)
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 Mild hot flashes 2. initially mild nausea which has resolved  Return to clinic in 6 months for follow-up

## 2015-12-03 ENCOUNTER — Telehealth: Payer: Self-pay | Admitting: Hematology and Oncology

## 2015-12-03 NOTE — Telephone Encounter (Signed)
Returned call to patient to scheduled next scheduled appointment for 05/31/2016. LVM

## 2015-12-08 ENCOUNTER — Telehealth: Payer: Self-pay | Admitting: Hematology and Oncology

## 2015-12-08 NOTE — Telephone Encounter (Signed)
Spoke with patient re April f/u.

## 2015-12-15 DIAGNOSIS — C50912 Malignant neoplasm of unspecified site of left female breast: Secondary | ICD-10-CM | POA: Diagnosis not present

## 2015-12-15 DIAGNOSIS — C50911 Malignant neoplasm of unspecified site of right female breast: Secondary | ICD-10-CM | POA: Diagnosis not present

## 2015-12-24 ENCOUNTER — Other Ambulatory Visit: Payer: Self-pay

## 2015-12-24 MED ORDER — ANASTROZOLE 1 MG PO TABS: 1.0000 mg | ORAL_TABLET | Freq: Every day | ORAL | 4 refills | Status: DC

## 2016-03-24 DIAGNOSIS — D0511 Intraductal carcinoma in situ of right breast: Secondary | ICD-10-CM | POA: Diagnosis not present

## 2016-03-24 DIAGNOSIS — C50912 Malignant neoplasm of unspecified site of left female breast: Secondary | ICD-10-CM | POA: Diagnosis not present

## 2016-04-19 ENCOUNTER — Encounter (HOSPITAL_COMMUNITY): Payer: Self-pay

## 2016-05-17 ENCOUNTER — Telehealth: Payer: Self-pay | Admitting: Hematology and Oncology

## 2016-05-17 NOTE — Telephone Encounter (Signed)
Needs move 4/24 appointment 5/4-5/11 prefers mornings   please call before scheduling  [phone]

## 2016-05-17 NOTE — Telephone Encounter (Signed)
Called and rescheduled pt 06/14/16 9am

## 2016-05-31 DIAGNOSIS — R5383 Other fatigue: Secondary | ICD-10-CM | POA: Diagnosis not present

## 2016-05-31 DIAGNOSIS — R209 Unspecified disturbances of skin sensation: Secondary | ICD-10-CM | POA: Diagnosis not present

## 2016-05-31 DIAGNOSIS — Z9884 Bariatric surgery status: Secondary | ICD-10-CM | POA: Diagnosis not present

## 2016-05-31 DIAGNOSIS — I10 Essential (primary) hypertension: Secondary | ICD-10-CM | POA: Diagnosis not present

## 2016-05-31 DIAGNOSIS — E119 Type 2 diabetes mellitus without complications: Secondary | ICD-10-CM | POA: Diagnosis not present

## 2016-06-01 ENCOUNTER — Ambulatory Visit: Payer: Medicare Other | Admitting: Hematology and Oncology

## 2016-06-08 DIAGNOSIS — Z1389 Encounter for screening for other disorder: Secondary | ICD-10-CM | POA: Diagnosis not present

## 2016-06-08 DIAGNOSIS — Z0000 Encounter for general adult medical examination without abnormal findings: Secondary | ICD-10-CM | POA: Diagnosis not present

## 2016-06-14 ENCOUNTER — Ambulatory Visit (HOSPITAL_BASED_OUTPATIENT_CLINIC_OR_DEPARTMENT_OTHER): Payer: Medicare Other | Admitting: Hematology and Oncology

## 2016-06-14 ENCOUNTER — Encounter: Payer: Self-pay | Admitting: Hematology and Oncology

## 2016-06-14 DIAGNOSIS — Z170 Estrogen receptor positive status [ER+]: Secondary | ICD-10-CM | POA: Diagnosis not present

## 2016-06-14 DIAGNOSIS — C50812 Malignant neoplasm of overlapping sites of left female breast: Secondary | ICD-10-CM | POA: Diagnosis not present

## 2016-06-14 DIAGNOSIS — C50811 Malignant neoplasm of overlapping sites of right female breast: Secondary | ICD-10-CM

## 2016-06-14 DIAGNOSIS — Z79811 Long term (current) use of aromatase inhibitors: Secondary | ICD-10-CM | POA: Diagnosis not present

## 2016-06-14 MED ORDER — ANASTROZOLE 1 MG PO TABS: 1.0000 mg | ORAL_TABLET | Freq: Every day | ORAL | 3 refills | Status: DC

## 2016-06-14 NOTE — Progress Notes (Signed)
Patient Care Team: Ernestene Kiel, MD as PCP - General (Internal Medicine)  DIAGNOSIS:  Encounter Diagnosis  Name Primary?  . 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  Tis N0 stage 0, ER/PR 100%      07/14/2015 Oncotype testing    Recurrence score: 17; ROR 11% (low-risk)      08/21/2015 Procedure    Genetic testing: Negative. Genes analyzed: APC, ATM, AXIN2, BARD1, BMPR1A, BRCA1, BRCA2, BRIP1, CDH1, CDK4, CDKN2A, CHEK2, EPCAM, FANCC, MLH1, MSH2, MSH6, MUTYH, NBN, PALB2, PMS2, POLD1, POLE, PTEN, RAD51C, RAD51D, SCG5/GREM1, SMAD4, STK11, TP53, VHL, and XRCC2.        08/26/2015 -  Anti-estrogen oral therapy    Anastrazole 1 mg daily. Planned duration of therapy: 5-10 years.        CHIEF COMPLIANT: Follow-up on anastrozole therapy  INTERVAL HISTORY: Rachel Castaneda is a 71 year old with above-mentioned history of bilateral mastectomies for left-sided breast cancer currently on anastrozole and tolerating it extremely well. She denies any lumps or nodules. She had lost 45 pounds in 6 months. She is very proud of it. She is doing intermittent fasting. She has also become vegan. Denies any hot flashes or myalgias. She does have chronic back problems.  REVIEW OF SYSTEMS:   Constitutional: Denies fevers, chills or abnormal weight loss Eyes: Denies blurriness of vision Ears, nose, mouth, throat, and face: Denies mucositis or sore throat Respiratory: Denies cough, dyspnea or  wheezes Cardiovascular: Denies palpitation, chest discomfort Gastrointestinal:  Denies nausea, heartburn or change in bowel habits Skin: Denies abnormal skin rashes Lymphatics: Denies new lymphadenopathy or easy bruising Neurological:Denies numbness, tingling or new weaknesses Behavioral/Psych: Mood is stable, no new changes  Extremities: No lower extremity edema Breast:  denies any pain or lumps or nodules in either chest wall or axilla All other systems were reviewed with the patient and are negative.  I have reviewed the past medical history, past surgical history, social history and family history with the patient and they are unchanged from previous note.  ALLERGIES:  is allergic to codeine; baclofen; dairy aid [lactase]; gabapentin; ibuprofen; topamax [topiramate]; adhesive [tape]; nsaids; and tramadol.  MEDICATIONS:  Current Outpatient Prescriptions  Medication Sig Dispense Refill  . acetaminophen (TYLENOL) 500 MG tablet Take 1,000 mg by mouth 3 (three) times daily.    . ALPHA LIPOIC ACID PO Take 100 mg by mouth daily.    Marland Kitchen anastrozole (ARIMIDEX) 1 MG tablet Take 1 tablet (1 mg total) by mouth daily. 30 tablet 4  . aspirin 81 MG tablet Take 81 mg by mouth daily. Reported on 08/14/2015    . Biotin 1000 MCG tablet Take 1,000 mcg by mouth daily.     . calcium carbonate (TUMS EX) 750 MG chewable tablet Chew by mouth daily as needed.    . cholecalciferol (VITAMIN D) 1000 UNITS tablet Take 1,000 Units by mouth daily.    . Cyanocobalamin (VITAMIN B 12 PO) Take 2,500 mcg by mouth once a week.     Marland Kitchen  famotidine (PEPCID) 10 MG tablet Take 10 mg by mouth 3 times/day as needed-between meals & bedtime for heartburn or indigestion.    . ferrous sulfate 325 (65 FE) MG EC tablet Take 65 mg by mouth daily with breakfast.     . metroNIDAZOLE (METROGEL) 0.75 % gel Apply 1 application topically daily.    . Multiple Vitamin (MULTIVITAMIN) tablet Take 1 tablet by mouth daily.    Marland Kitchen nystatin  (MYCOSTATIN/NYSTOP) powder Apply topically daily as needed.  98  . nystatin cream (MYCOSTATIN) Apply topically daily as needed.  98  . Potassium 99 MG TABS Take 99 mg by mouth daily.      No current facility-administered medications for this visit.     PHYSICAL EXAMINATION: ECOG PERFORMANCE STATUS: 1 - Symptomatic but completely ambulatory  Vitals:   06/14/16 0844  BP: (!) 142/81  Pulse: 69  Resp: 18  Temp: 98.2 F (36.8 C)   Filed Weights   06/14/16 0844  Weight: 175 lb 14.4 oz (79.8 kg)    GENERAL:alert, no distress and comfortable SKIN: skin color, texture, turgor are normal, no rashes or significant lesions EYES: normal, Conjunctiva are pink and non-injected, sclera clear OROPHARYNX:no exudate, no erythema and lips, buccal mucosa, and tongue normal  NECK: supple, thyroid normal size, non-tender, without nodularity LYMPH:  no palpable lymphadenopathy in the cervical, axillary or inguinal LUNGS: clear to auscultation and percussion with normal breathing effort HEART: regular rate & rhythm and no murmurs and no lower extremity edema ABDOMEN:abdomen soft, non-tender and normal bowel sounds MUSCULOSKELETAL:no cyanosis of digits and no clubbing  NEURO: alert & oriented x 3 with fluent speech, no focal motor/sensory deficits EXTREMITIES: No lower extremity edema BREAST: No palpable masses or nodules in either right or left chest walls or axilla. No palpable axillary supraclavicular or infraclavicular adenopathy . (exam performed in the presence of a chaperone)  LABORATORY DATA:  I have reviewed the data as listed   Chemistry      Component Value Date/Time   NA 142 07/10/2015 1013   K 3.5 07/10/2015 1013   CL 105 07/10/2015 1013   CO2 29 07/10/2015 1013   BUN 11 07/10/2015 1013   CREATININE 0.60 07/10/2015 1013      Component Value Date/Time   CALCIUM 9.1 07/10/2015 1013   ALKPHOS 86 07/10/2015 1013   AST 21 07/10/2015 1013   ALT 16 07/10/2015 1013   BILITOT 0.6  07/10/2015 1013       Lab Results  Component Value Date   WBC 11.1 (H) 07/15/2015   HGB 11.3 (L) 07/15/2015   HCT 34.8 (L) 07/15/2015   MCV 94.8 07/15/2015   PLT 191 07/15/2015   NEUTROABS 4.1 07/10/2015    ASSESSMENT & PLAN:  Bilateral breast cancer ([place]) Bilateral mastectomies 07/14/2015: Left: IDC grade to 1.7 cm, with DCIS, LVI present, 0/5 lymph nodes negative, T1 cN0 stage IA, ER 100%, PR positive, HER-2 negative, Ki-67 15%; right: 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 Mild hot flashes 2. initially mild nausea which has resolved  Surveillance: 1. Breast exam 06/14/2016: No palpable abnormalities 2. mammograms: No role of mammogram since she had bilateral mastectomies  Return to clinic in 1 year for follow-up   I spent 15 minutes talking to the patient of which more than half was  spent in counseling and coordination of care.  No orders of the defined types were placed in this encounter.  The patient has a good understanding of the overall plan. she agrees with it. she will call with any problems that may develop before the next visit here.   Rulon Eisenmenger, MD 06/14/16

## 2016-06-14 NOTE — Assessment & Plan Note (Signed)
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 Mild hot flashes 2. initially mild nausea which has resolved  Surveillance: 1. Breast exam 06/14/2016: No palpable abnormalities 2. mammograms: No role of mammogram since she had bilateral mastectomies  Return to clinic in 1 year for follow-up

## 2016-09-08 DIAGNOSIS — R7989 Other specified abnormal findings of blood chemistry: Secondary | ICD-10-CM | POA: Diagnosis not present

## 2016-09-08 DIAGNOSIS — R8299 Other abnormal findings in urine: Secondary | ICD-10-CM | POA: Diagnosis not present

## 2016-09-16 DIAGNOSIS — R7989 Other specified abnormal findings of blood chemistry: Secondary | ICD-10-CM | POA: Diagnosis not present

## 2016-10-13 DIAGNOSIS — Z853 Personal history of malignant neoplasm of breast: Secondary | ICD-10-CM | POA: Diagnosis not present

## 2016-10-13 DIAGNOSIS — Z9013 Acquired absence of bilateral breasts and nipples: Secondary | ICD-10-CM | POA: Diagnosis not present

## 2016-10-13 DIAGNOSIS — C50912 Malignant neoplasm of unspecified site of left female breast: Secondary | ICD-10-CM | POA: Diagnosis not present

## 2016-11-16 DIAGNOSIS — Z23 Encounter for immunization: Secondary | ICD-10-CM | POA: Diagnosis not present

## 2016-12-13 DIAGNOSIS — Z9011 Acquired absence of right breast and nipple: Secondary | ICD-10-CM | POA: Diagnosis not present

## 2016-12-13 DIAGNOSIS — C50912 Malignant neoplasm of unspecified site of left female breast: Secondary | ICD-10-CM | POA: Diagnosis not present

## 2016-12-13 DIAGNOSIS — Z853 Personal history of malignant neoplasm of breast: Secondary | ICD-10-CM | POA: Diagnosis not present

## 2017-03-17 DIAGNOSIS — R21 Rash and other nonspecific skin eruption: Secondary | ICD-10-CM | POA: Diagnosis not present

## 2017-03-17 DIAGNOSIS — M79641 Pain in right hand: Secondary | ICD-10-CM | POA: Diagnosis not present

## 2017-03-17 DIAGNOSIS — M2550 Pain in unspecified joint: Secondary | ICD-10-CM | POA: Diagnosis not present

## 2017-03-17 DIAGNOSIS — M79642 Pain in left hand: Secondary | ICD-10-CM | POA: Diagnosis not present

## 2017-03-18 DIAGNOSIS — M19041 Primary osteoarthritis, right hand: Secondary | ICD-10-CM | POA: Diagnosis not present

## 2017-03-18 DIAGNOSIS — M19042 Primary osteoarthritis, left hand: Secondary | ICD-10-CM | POA: Diagnosis not present

## 2017-04-07 ENCOUNTER — Other Ambulatory Visit: Payer: Self-pay | Admitting: Internal Medicine

## 2017-04-07 DIAGNOSIS — E2839 Other primary ovarian failure: Secondary | ICD-10-CM

## 2017-04-18 DIAGNOSIS — D485 Neoplasm of uncertain behavior of skin: Secondary | ICD-10-CM | POA: Diagnosis not present

## 2017-04-18 DIAGNOSIS — L723 Sebaceous cyst: Secondary | ICD-10-CM | POA: Diagnosis not present

## 2017-04-18 DIAGNOSIS — L509 Urticaria, unspecified: Secondary | ICD-10-CM | POA: Diagnosis not present

## 2017-04-18 DIAGNOSIS — L820 Inflamed seborrheic keratosis: Secondary | ICD-10-CM | POA: Diagnosis not present

## 2017-04-18 DIAGNOSIS — D1030 Benign neoplasm of unspecified part of mouth: Secondary | ICD-10-CM | POA: Diagnosis not present

## 2017-05-19 DIAGNOSIS — C50912 Malignant neoplasm of unspecified site of left female breast: Secondary | ICD-10-CM | POA: Diagnosis not present

## 2017-05-19 DIAGNOSIS — D0511 Intraductal carcinoma in situ of right breast: Secondary | ICD-10-CM | POA: Diagnosis not present

## 2017-05-20 ENCOUNTER — Telehealth: Payer: Self-pay | Admitting: Hematology and Oncology

## 2017-05-20 NOTE — Telephone Encounter (Signed)
Patient called to reschedule she is going on a road trip for 6weeks

## 2017-06-13 ENCOUNTER — Ambulatory Visit: Payer: Medicare Other | Admitting: Hematology and Oncology

## 2017-07-18 DIAGNOSIS — C50912 Malignant neoplasm of unspecified site of left female breast: Secondary | ICD-10-CM | POA: Diagnosis not present

## 2017-07-18 DIAGNOSIS — C50911 Malignant neoplasm of unspecified site of right female breast: Secondary | ICD-10-CM | POA: Diagnosis not present

## 2017-08-12 DIAGNOSIS — E559 Vitamin D deficiency, unspecified: Secondary | ICD-10-CM | POA: Diagnosis not present

## 2017-08-12 DIAGNOSIS — Z1389 Encounter for screening for other disorder: Secondary | ICD-10-CM | POA: Diagnosis not present

## 2017-08-12 DIAGNOSIS — Z0000 Encounter for general adult medical examination without abnormal findings: Secondary | ICD-10-CM | POA: Diagnosis not present

## 2017-08-12 DIAGNOSIS — Z79899 Other long term (current) drug therapy: Secondary | ICD-10-CM | POA: Diagnosis not present

## 2017-08-12 DIAGNOSIS — Z1331 Encounter for screening for depression: Secondary | ICD-10-CM | POA: Diagnosis not present

## 2017-08-12 DIAGNOSIS — R7301 Impaired fasting glucose: Secondary | ICD-10-CM | POA: Diagnosis not present

## 2017-08-22 ENCOUNTER — Inpatient Hospital Stay: Payer: Medicare Other | Attending: Hematology and Oncology | Admitting: Hematology and Oncology

## 2017-08-22 ENCOUNTER — Telehealth: Payer: Self-pay | Admitting: Hematology and Oncology

## 2017-08-22 DIAGNOSIS — Z9013 Acquired absence of bilateral breasts and nipples: Secondary | ICD-10-CM | POA: Insufficient documentation

## 2017-08-22 DIAGNOSIS — C50812 Malignant neoplasm of overlapping sites of left female breast: Secondary | ICD-10-CM | POA: Diagnosis not present

## 2017-08-22 DIAGNOSIS — Z170 Estrogen receptor positive status [ER+]: Secondary | ICD-10-CM | POA: Insufficient documentation

## 2017-08-22 DIAGNOSIS — C50811 Malignant neoplasm of overlapping sites of right female breast: Secondary | ICD-10-CM

## 2017-08-22 NOTE — Progress Notes (Signed)
Patient Care Team: Ernestene Kiel, MD as PCP - General (Internal Medicine)  DIAGNOSIS:  Encounter Diagnosis  Name Primary?  . 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  Tis N0 stage 0, ER/PR 100%      07/14/2015 Oncotype testing    Recurrence score: 17; ROR 11% (low-risk)      08/21/2015 Procedure    Genetic testing: Negative. Genes analyzed: APC, ATM, AXIN2, BARD1, BMPR1A, BRCA1, BRCA2, BRIP1, CDH1, CDK4, CDKN2A, CHEK2, EPCAM, FANCC, MLH1, MSH2, MSH6, MUTYH, NBN, PALB2, PMS2, POLD1, POLE, PTEN, RAD51C, RAD51D, SCG5/GREM1, SMAD4, STK11, TP53, VHL, and XRCC2.        08/26/2015 - 05/08/2017 Anti-estrogen oral therapy    Anastrazole 1 mg daily. Planned duration of therapy: 5-10 years.        CHIEF COMPLIANT: Follow-up on anastrozole therapy.  She stopped it in March 2019  INTERVAL HISTORY: NORABELLE KONDO is a 72 year old with above-mentioned history of bilateral mastectomies for left-sided breast cancer and right-sided DCIS who was on anastrozole therapy.  She was having extraordinary amount of toxicities from anastrozole.  This included fogginess in the mind profound generalized aches and pains and generalized weakness and she decided to stop anastrozole in March 2019.  Immediately she started to feel better and she is feeling amazing to the point that she is planning on traveling with her son across [place] in the world.  She denies any lumps or nodules in the chest wall or  axilla.  REVIEW OF SYSTEMS:   Constitutional: Denies fevers, chills or abnormal weight loss Eyes: Denies blurriness of vision Ears, nose, mouth, throat, and face: Denies mucositis or sore throat Respiratory: Denies cough, dyspnea or wheezes Cardiovascular: Denies palpitation, chest discomfort Gastrointestinal:  Denies nausea, heartburn or change in bowel habits Skin: Denies abnormal skin rashes Lymphatics: Denies new lymphadenopathy or easy bruising Neurological:Denies numbness, tingling or new weaknesses Behavioral/Psych: Mood is stable, no new changes  Extremities: No lower extremity edema Breast:  denies any pain or lumps or nodules  All other systems were reviewed with the patient and are negative.  I have reviewed the past medical history, past surgical history, social history and family history with the patient and they are unchanged from previous note.  ALLERGIES:  is allergic to codeine; baclofen; dairy aid [lactase]; gabapentin; ibuprofen; topamax [topiramate]; adhesive [tape]; nsaids; and tramadol.  MEDICATIONS:  Current Outpatient Medications  Medication Sig Dispense Refill  . Biotin 1000 MCG tablet Take 1,000 mcg by mouth daily.     . Cyanocobalamin (VITAMIN B 12 PO) Take 2,500 mcg by mouth once a week.     . metroNIDAZOLE (METROGEL) 0.75 % gel Apply 1 application topically daily.    Marland Kitchen nystatin (MYCOSTATIN/NYSTOP) powder Apply topically daily as needed.  98  . nystatin cream (MYCOSTATIN) Apply topically daily as needed.  98   No current facility-administered medications for this visit.     PHYSICAL EXAMINATION: ECOG PERFORMANCE STATUS: 1 - Symptomatic but completely ambulatory  Vitals:   08/22/17  0854  BP: (!) 147/78  Pulse: 80  Resp: 17  Temp: 98.6 F (37 C)  SpO2: 99%   Filed Weights   08/22/17 0854  Weight: 167 lb 11.2 oz (76.1 kg)    GENERAL:alert, no distress and comfortable SKIN: skin color, texture, turgor are normal, no rashes or significant  lesions EYES: normal, Conjunctiva are pink and non-injected, sclera clear OROPHARYNX:no exudate, no erythema and lips, buccal mucosa, and tongue normal  NECK: supple, thyroid normal size, non-tender, without nodularity LYMPH:  no palpable lymphadenopathy in the cervical, axillary or inguinal LUNGS: clear to auscultation and percussion with normal breathing effort HEART: regular rate & rhythm and no murmurs and no lower extremity edema ABDOMEN:abdomen soft, non-tender and normal bowel sounds MUSCULOSKELETAL:no cyanosis of digits and no clubbing  NEURO: alert & oriented x 3 with fluent speech, no focal motor/sensory deficits EXTREMITIES: No lower extremity edema BREAST: No palpable masses or nodules in either chest wall or axilla (exam performed in the presence of a chaperone)  LABORATORY DATA:  I have reviewed the data as listed CMP Latest Ref Rng & Units 07/10/2015 04/06/2012  Glucose 65 - 99 mg/dL 100(H) 99  BUN 6 - 20 mg/dL 11 15  Creatinine 0.44 - 1.00 mg/dL 0.60 0.54  Sodium 135 - 145 mmol/L 142 142  Potassium 3.5 - 5.1 mmol/L 3.5 4.4  Chloride 101 - 111 mmol/L 105 103  CO2 22 - 32 mmol/L 29 32  Calcium 8.9 - 10.3 mg/dL 9.1 9.5  Total Protein 6.5 - 8.1 g/dL 6.3(L) -  Total Bilirubin 0.3 - 1.2 mg/dL 0.6 -  Alkaline Phos 38 - 126 U/L 86 -  AST 15 - 41 U/L 21 -  ALT 14 - 54 U/L 16 -    Lab Results  Component Value Date   WBC 11.1 (H) 07/15/2015   HGB 11.3 (L) 07/15/2015   HCT 34.8 (L) 07/15/2015   MCV 94.8 07/15/2015   PLT 191 07/15/2015   NEUTROABS 4.1 07/10/2015    ASSESSMENT & PLAN:  Bilateral breast cancer ([place]) Bilateral mastectomies 07/14/2015: Left: IDC grade to 1.7 cm, with DCIS, LVI present, 0/5 lymph nodes negative, T1 cN0 stage IA, ER 100%, PR positive, HER-2 negative, Ki-67 15%; 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 Breast exam 08/22/2017: No palpable abnormalities 2. mammograms: No role of mammogram since she had bilateral mastectomies  Return to clinic in 1 year for follow-up   No orders of the defined types were placed in this encounter.  The patient has a good understanding of the overall plan. she agrees with it. she will call with any problems that may develop before the next visit here.   Harriette Ohara, MD 08/22/17

## 2017-08-22 NOTE — Assessment & Plan Note (Signed)
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 Mild hot flashes 2. initially mild nausea which has resolved  Surveillance: 1. Breast exam 08/22/2017: No palpable abnormalities 2. mammograms: No role of mammogram since she had bilateral mastectomies  Return to clinic in 1 year for follow-up

## 2017-08-22 NOTE — Telephone Encounter (Signed)
Gave avs and calendar ° °

## 2017-11-23 DIAGNOSIS — Z23 Encounter for immunization: Secondary | ICD-10-CM | POA: Diagnosis not present

## 2018-01-19 DIAGNOSIS — E162 Hypoglycemia, unspecified: Secondary | ICD-10-CM | POA: Diagnosis not present

## 2018-01-19 DIAGNOSIS — E669 Obesity, unspecified: Secondary | ICD-10-CM | POA: Diagnosis not present

## 2018-01-19 DIAGNOSIS — R635 Abnormal weight gain: Secondary | ICD-10-CM | POA: Diagnosis not present

## 2018-01-19 DIAGNOSIS — Z6832 Body mass index (BMI) 32.0-32.9, adult: Secondary | ICD-10-CM | POA: Diagnosis not present

## 2018-03-27 DIAGNOSIS — E162 Hypoglycemia, unspecified: Secondary | ICD-10-CM | POA: Diagnosis not present

## 2018-03-27 DIAGNOSIS — Z6831 Body mass index (BMI) 31.0-31.9, adult: Secondary | ICD-10-CM | POA: Diagnosis not present

## 2018-08-16 DIAGNOSIS — Z789 Other specified health status: Secondary | ICD-10-CM | POA: Diagnosis not present

## 2018-08-16 DIAGNOSIS — E559 Vitamin D deficiency, unspecified: Secondary | ICD-10-CM | POA: Diagnosis not present

## 2018-08-16 DIAGNOSIS — R300 Dysuria: Secondary | ICD-10-CM | POA: Diagnosis not present

## 2018-08-16 DIAGNOSIS — Z9884 Bariatric surgery status: Secondary | ICD-10-CM | POA: Diagnosis not present

## 2018-08-23 ENCOUNTER — Ambulatory Visit: Payer: Medicare Other | Admitting: Hematology and Oncology

## 2019-01-02 DIAGNOSIS — C50912 Malignant neoplasm of unspecified site of left female breast: Secondary | ICD-10-CM | POA: Diagnosis not present

## 2019-01-02 DIAGNOSIS — C50911 Malignant neoplasm of unspecified site of right female breast: Secondary | ICD-10-CM | POA: Diagnosis not present

## 2019-02-14 DIAGNOSIS — L539 Erythematous condition, unspecified: Secondary | ICD-10-CM | POA: Diagnosis not present

## 2019-03-06 DIAGNOSIS — D4709 Other mast cell neoplasms of uncertain behavior: Secondary | ICD-10-CM | POA: Diagnosis not present

## 2019-03-06 DIAGNOSIS — K9049 Malabsorption due to intolerance, not elsewhere classified: Secondary | ICD-10-CM | POA: Diagnosis not present

## 2019-03-06 DIAGNOSIS — L509 Urticaria, unspecified: Secondary | ICD-10-CM | POA: Diagnosis not present

## 2019-03-07 DIAGNOSIS — D4709 Other mast cell neoplasms of uncertain behavior: Secondary | ICD-10-CM | POA: Diagnosis not present

## 2019-03-13 DIAGNOSIS — D4709 Other mast cell neoplasms of uncertain behavior: Secondary | ICD-10-CM | POA: Diagnosis not present

## 2019-03-27 DIAGNOSIS — I444 Left anterior fascicular block: Secondary | ICD-10-CM | POA: Diagnosis not present

## 2019-03-30 DIAGNOSIS — D4709 Other mast cell neoplasms of uncertain behavior: Secondary | ICD-10-CM | POA: Diagnosis not present

## 2019-04-19 ENCOUNTER — Telehealth: Payer: Self-pay

## 2019-04-19 NOTE — Telephone Encounter (Signed)
New message  Patient states that she was calling to follow up about referral that was faxed to [facility] office on 04/17/2019 at 9:50 am. Please give patient a call back to confirm if referral was received.

## 2019-04-24 NOTE — Telephone Encounter (Signed)
NOTES ON FILE FROM [facility] [id number] REFERRAL TO SCHEDULING

## 2019-04-30 NOTE — Progress Notes (Signed)
Cardiology Office Note:    Date:  05/10/2019   ID:  Rachel Castaneda, DOB Sep 07, 1945, MRN [id number]  PCP:  Ernestene Kiel, MD  Cardiologist:  No primary care provider on file.  Electrophysiologist:  None   Referring MD: Ernestene Kiel, MD   Chief Complaint  Patient presents with  . Abnormal ECG     History of Present Illness:    Rachel Castaneda is a 74 y.o. female with a hx of type 2 diabetes, hypertension, morbid obesity status post gastric bypass, hemochromatosis who is referred by Dr. 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  Reports that diabetes and hypertension resolved with weight loss following gastric bypass in 2000.  She lost 180 pounds following her gastric bypass.  She was taking iron supplements after bypass and was found to have elevated ferritin and ultimately diagnosed with hemochromatosis.  She undergoes periodic phlebotomy.  She reports that she walks for 45 to 90 minutes daily.  Denies any exertional chest pain.  Does have some dyspnea when walking up steep hills.  Reports occasional palpitations when very stressed, but occurs rarely.   Past Medical History:  Diagnosis Date  . Anemia   . Anxiety    pt. admits to anxiety, currently, not taking meds.   . Arthritis    lumbar region  . Diabetes mellitus    Type II, prior to gastric bypass , off Metformin since 2008  . Dysrhythmia    irreg heart beats  . Fall    related to dog attack- Nov. 11.2013  . Family history of anesthesia complication    daughter- extremely agitated   . Headache(784.0)    h/o migraines   . HTN (hypertension)    no meds in 75yrs  . Hypertension    HTN- prior to gastric bypass, seen by cardiologist- stress test done, last one mid 1990's- pt. reports it was WNL.  Marland Kitchen Other specified intestinal malabsorption   . Sleep apnea    used CPAP- prior to gastric bypass, no longer needs.  Marland Kitchen UTI (urinary tract infection)   . Varicose veins     Past Surgical History:    Procedure Laterality Date  . ABDOMINAL HYSTERECTOMY  1989  . BACK SURGERY  2014   lumbar fusion  . CARPAL TUNNEL RELEASE     bilateral  . CESAREAN SECTION     x3  . CHOLECYSTECTOMY  2000   Gall Bladder  . COLONOSCOPY  2008  . CYSTECTOMY  2000   Sinus cyst  . GASTRIC BYPASS  2000  . [place]  and   2001  . MASTECTOMY W/ SENTINEL NODE BIOPSY Bilateral 07/14/2015   Procedure: BILATERAL TOTAL MASTECTOMIES WITH LEFT AXILLARY SENTINEL LYMPH NODE BIOPSY;  Surgeon: Rolm Bookbinder, MD;  Location: [place];  Service: General;  Laterality: Bilateral;  . NASAL SINUS SURGERY    . TONSILLECTOMY      Current Medications: Current Meds  Medication Sig  . acetaminophen (TYLENOL) 500 MG tablet Take by mouth.  . Biotin 1000 MCG tablet Take 1,000 mcg by mouth daily.   . Cyanocobalamin (VITAMIN B 12 PO) Take 2,500 mcg by mouth once a week.   Mariane Baumgarten Sodium (DSS) 100 MG CAPS Take by mouth.  . fexofenadine (ALLEGRA ALLERGY) 180 MG tablet   . hydrocortisone cream 1 % Apply topically.  . metroNIDAZOLE (METROGEL) 0.75 % gel Apply topically.  . Multiple Vitamin (MULTI-VITAMIN) tablet Take by mouth.  . nystatin cream (MYCOSTATIN) Apply  topically.     Allergies:   Codeine, Baclofen, Dairy aid [lactase], Gabapentin, Ibuprofen, Topamax [topiramate], Adhesive [tape], Nsaids, and Tramadol   Social History   Socioeconomic History  . Marital status: Divorced    Spouse name: Not on file  . Number of children: Not on file  . Years of education: Not on file  . Highest education level: Not on file  Occupational History  . Not on file  Tobacco Use  . Smoking status: Never Smoker  . Smokeless tobacco: Never Used  Substance and Sexual Activity  . Alcohol use: Yes    Comment: extremely rarely  . Drug use: No  . Sexual activity: Yes    Birth control/protection: Surgical  Other Topics Concern  . Not on file  Social History Narrative  . Not on file   Social  Determinants of Health   Financial Resource Strain:   . Difficulty of Paying Living Expenses:   Food Insecurity:   . Worried About Charity fundraiser in the Last Year:   . Arboriculturist in the Last Year:   Transportation Needs:   . Film/video editor (Medical):   Marland Kitchen Lack of Transportation (Non-Medical):   Physical Activity:   . Days of Exercise per Week:   . Minutes of Exercise per Session:   Stress:   . Feeling of Stress :   Social Connections:   . Frequency of Communication with Friends and Family:   . Frequency of Social Gatherings with Friends and Family:   . Attends Religious Services:   . Active Member of Clubs or Organizations:   . Attends Archivist Meetings:   Marland Kitchen Marital Status:      Family History: The patient's family history includes Basal cell carcinoma in her daughter; Bladder Cancer (age of onset: 54) in her maternal grandmother; Breast cancer in her cousin; Diabetes in her paternal uncle; Heart attack in her paternal grandfather and paternal grandmother; Hypertension in her mother; Lupus in her daughter; Other in her mother; Ovarian cancer in her maternal aunt and maternal aunt; Skin cancer in her mother; Squamous cell carcinoma in her son; Stroke in her maternal aunt, mother, and paternal uncle; Uterine cancer in her maternal grandmother.  ROS:   Please see the history of present illness.     All other systems reviewed and are negative.  EKGs/Labs/Other Studies Reviewed:    The following studies were reviewed today:   EKG:  EKG is  ordered today.  The ekg ordered today demonstrates normal sinus rhythm, rate 88, incomplete right bundle branch block, right axis deviation, low voltage in limb leads  Recent Labs: No results found for requested labs within last 8760 hours.  Recent Lipid Panel No results found for: CHOL, TRIG, HDL, CHOLHDL, VLDL, LDLCALC, LDLDIRECT  Physical Exam:    VS:  BP 120/84   Pulse 88   Temp (!) 97.4 F (36.3 C)   Ht  5\' 3"  (1.6 m)   Wt 150 lb (68 kg)   SpO2 97%   BMI 26.57 kg/m     Wt Readings from Last 3 Encounters:  05/04/19 150 lb (68 kg)  08/22/17 167 lb 11.2 oz (76.1 kg)  06/14/16 175 lb 14.4 oz (79.8 kg)     GEN:  in no acute distress HEENT: Normal NECK: No JVD CARDIAC: RRR, no murmurs, rubs, gallops RESPIRATORY:  Clear to auscultation without rales, wheezing or rhonchi  ABDOMEN: Soft, non-tender, non-distended MUSCULOSKELETAL:  No edema; No deformity  SKIN: Warm and dry NEUROLOGIC:  Alert and oriented x 3 PSYCHIATRIC:  Normal affect   ASSESSMENT:    1. Abnormal EKG   2. Essential hypertension   3. Hemochromatosis, unspecified hemochromatosis type    PLAN:     Abnormal EKG: Incomplete right bundle branch block and right axis deviation.  Low voltage in limb leads.  Given her history of hemochromatosis, low voltage could represent cardiac involvement.  Will evaluate with TTE and consider cardiac MRI if any concern for cardiac hemochromatosis.  Hemochromatosis: Reports has been well controlled, undergoes intermittent phlebotomy  Hypertension: Resolved with weight loss following gastric bypass.  Appears well controlled off medications currently  RTC in 1 month  Medication Adjustments/Labs and Tests Ordered: Current medicines are reviewed at length with the patient today.  Concerns regarding medicines are outlined above.  Orders Placed This Encounter  Procedures  . EKG 12-Lead  . ECHOCARDIOGRAM COMPLETE   No orders of the defined types were placed in this encounter.   Patient Instructions  Medication Instructions:  Your physician recommends that you continue on your current medications as directed. Please refer to the Current Medication list given to you today.  *If you need a refill on your cardiac medications before your next appointment, please call your pharmacy*   Lab Work: NONE  Testing/Procedures: Your physician has requested that you have an echocardiogram.  Echocardiography is a painless test that uses sound waves to create images of your heart. It provides your doctor with information about the size and shape of your heart and how well your heart's chambers and valves are working. This procedure takes approximately one hour. There are no restrictions for this procedure.  This will be done at our [facility] location:  [place]: At [facility], you and your health needs are our priority.  As part of our continuing mission to provide you with exceptional heart care, we have created designated Provider Care Teams.  These Care Teams include your primary Cardiologist (physician) and Advanced Practice Providers (APPs -  Physician Assistants and Nurse Practitioners) who all work together to provide you with the care you need, when you need it.  We recommend signing up for the patient portal called "MyChart".  Sign up information is provided on this After Visit Summary.  MyChart is used to connect with patients for Virtual Visits (Telemedicine).  Patients are able to view lab/test results, encounter notes, upcoming appointments, etc.  Non-urgent messages can be sent to your provider as well.   To learn more about what you can do with MyChart, go to [email or web address].    Your next appointment:   1 month(s)  The format for your next appointment:   In Person  Provider:   Oswaldo Milian, MD         Signed, Donato Heinz, MD  05/10/2019 7:34 PM    [place]

## 2019-05-04 ENCOUNTER — Other Ambulatory Visit: Payer: Self-pay

## 2019-05-04 ENCOUNTER — Ambulatory Visit: Payer: Medicare Other | Admitting: Cardiology

## 2019-05-04 ENCOUNTER — Encounter: Payer: Self-pay | Admitting: Cardiology

## 2019-05-04 VITALS — BP 120/84 | HR 88 | Temp 97.4°F | Ht 63.0 in | Wt 150.0 lb

## 2019-05-04 DIAGNOSIS — R9431 Abnormal electrocardiogram [ECG] [EKG]: Secondary | ICD-10-CM | POA: Diagnosis not present

## 2019-05-04 DIAGNOSIS — I10 Essential (primary) hypertension: Secondary | ICD-10-CM

## 2019-05-04 NOTE — Patient Instructions (Signed)
Medication Instructions:  Your physician recommends that you continue on your current medications as directed. Please refer to the Current Medication list given to you today.  *If you need a refill on your cardiac medications before your next appointment, please call your pharmacy*   Lab Work: NONE  Testing/Procedures: Your physician has requested that you have an echocardiogram. Echocardiography is a painless test that uses sound waves to create images of your heart. It provides your doctor with information about the size and shape of your heart and how well your heart's chambers and valves are working. This procedure takes approximately one hour. There are no restrictions for this procedure.  This will be done at our [facility] location:  [place]: At [facility], you and your health needs are our priority.  As part of our continuing mission to provide you with exceptional heart care, we have created designated Provider Care Teams.  These Care Teams include your primary Cardiologist (physician) and Advanced Practice Providers (APPs -  Physician Assistants and Nurse Practitioners) who all work together to provide you with the care you need, when you need it.  We recommend signing up for the patient portal called "MyChart".  Sign up information is provided on this After Visit Summary.  MyChart is used to connect with patients for Virtual Visits (Telemedicine).  Patients are able to view lab/test results, encounter notes, upcoming appointments, etc.  Non-urgent messages can be sent to your provider as well.   To learn more about what you can do with MyChart, go to [email or web address].    Your next appointment:   1 month(s)  The format for your next appointment:   In Person  Provider:   Oswaldo Milian, MD

## 2019-05-18 ENCOUNTER — Telehealth: Payer: Self-pay | Admitting: Hematology and Oncology

## 2019-05-18 NOTE — Telephone Encounter (Signed)
Called pt per 4/8 sch message - unable to reach pt - left message for pt to call back to set up appt

## 2019-05-23 NOTE — Progress Notes (Signed)
Patient Care Team: Ernestene Kiel, MD as PCP - General (Internal Medicine)  DIAGNOSIS:    ICD-10-CM   1. 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  Tis N0 stage 0, ER/PR 100%   07/14/2015 Oncotype testing   Recurrence score: 17; ROR 11% (low-risk)   08/21/2015 Procedure   Genetic testing: Negative. Genes analyzed: APC, ATM, AXIN2, BARD1, BMPR1A, BRCA1, BRCA2, BRIP1, CDH1, CDK4, CDKN2A, CHEK2, EPCAM, FANCC, MLH1, MSH2, MSH6, MUTYH, NBN, PALB2, PMS2, POLD1, POLE, PTEN, RAD51C, RAD51D, SCG5/GREM1, SMAD4, STK11, TP53, VHL, and XRCC2.     08/26/2015 - 05/08/2017 Anti-estrogen oral therapy   Anastrazole 1 mg daily. Planned duration of therapy: 5-10 years.      CHIEF COMPLIANT: Follow-up of left breast cancer on surveillance   INTERVAL HISTORY: Rachel Castaneda is a 74 y.o. with above-mentioned history of left breast cancer who underwent bilateral mastectomies and was on antiestrogen therapy with anastrozole, but it was discontinued due to severe toxicities. I last saw her 1.5 years ago. She presents to the clinic today for follow-up.  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  Since she started eating and a low histamine diet the rash that she was experiencing previously has disappeared.  She is a vegan and since she has been eating a low-carb diet her blood sugars are much better.  Previously when she ate carbs her blood  sugars were dropping significantly.  This is probably related to her gastric bypass surgery.  She is interested today to find out about latissimus reconstruction for the breast.  She is seeing a cardiology for some abnormalities on EKG.  She tells me that her daughter was diagnosed with mastocytosis and had major events including code after receiving dye for MRI.  ALLERGIES:  is allergic to codeine; baclofen; dairy aid [lactase]; gabapentin; ibuprofen; topamax [topiramate]; adhesive [tape]; nsaids; and tramadol.  MEDICATIONS:  Current Outpatient Medications  Medication Sig Dispense Refill  . acetaminophen (TYLENOL) 500 MG tablet Take by mouth.    . Biotin 1000 MCG tablet Take 1,000 mcg by mouth daily.     . Cyanocobalamin (VITAMIN B 12 PO) Take 2,500 mcg by mouth once a week.     Mariane Baumgarten Sodium (DSS) 100 MG CAPS Take by mouth.    . fexofenadine (ALLEGRA ALLERGY) 180 MG tablet     . hydrocortisone cream 1 % Apply topically.    . metroNIDAZOLE (METROGEL) 0.75 % gel Apply topically.    . Multiple Vitamin (MULTI-VITAMIN) tablet Take by mouth.    . nystatin cream (MYCOSTATIN) Apply topically.     No current facility-administered medications for this visit.    PHYSICAL EXAMINATION: ECOG PERFORMANCE STATUS: 1 - Symptomatic but completely ambulatory  Vitals:   05/24/19 0812  BP: 124/87  Pulse: 80  Resp: 17  Temp: 98.2 F (36.8 C)  SpO2: 100%   Filed Weights   05/24/19 0812  Weight: 146 lb 3.2 oz (  66.3 kg)    BREAST: Bilateral mastectomies, patient has extra skin and fat under the axilla which bothers her.  (exam performed in the presence of a chaperone)  LABORATORY DATA:  I have reviewed the data as listed CMP Latest Ref Rng & Units 07/10/2015 04/06/2012  Glucose 65 - 99 mg/dL 100(H) 99  BUN 6 - 20 mg/dL 11 15  Creatinine 0.44 - 1.00 mg/dL 0.60 0.54  Sodium 135 - 145 mmol/L 142 142  Potassium 3.5 - 5.1 mmol/L 3.5 4.4  Chloride 101 - 111 mmol/L 105 103  CO2 22 - 32 mmol/L 29 32   Calcium 8.9 - 10.3 mg/dL 9.1 9.5  Total Protein 6.5 - 8.1 g/dL 6.3(L) -  Total Bilirubin 0.3 - 1.2 mg/dL 0.6 -  Alkaline Phos 38 - 126 U/L 86 -  AST 15 - 41 U/L 21 -  ALT 14 - 54 U/L 16 -    Lab Results  Component Value Date   WBC 11.1 (H) 07/15/2015   HGB 11.3 (L) 07/15/2015   HCT 34.8 (L) 07/15/2015   MCV 94.8 07/15/2015   PLT 191 07/15/2015   NEUTROABS 4.1 07/10/2015    ASSESSMENT & PLAN:  Bilateral breast cancer ([place]) Bilateral mastectomies 07/14/2015: Left: IDC grade to 1.7 cm, with DCIS, LVI present, 0/5 lymph nodes negative, T1 cN0 stage IA, ER 100%, PR positive, HER-2 negative, Ki-67 15%; right: 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  She now takes a low histamine diet and Allegra which has been helping her.  Rash has resolved.  Anastrozole 1 mg dailystarted 08/26/2015 completed March 2019 stopped due to toxicities   Surveillance: 1.Breast exam  05/24/2019: No palpable abnormalities 2.mammograms: No role of mammogram since she had bilateral mastectomies  Patient wants to consider breast reconstruction.  Because she will need a latissimus dorsi muscle reconstruction process, I recommended that she see plastic surgery at [facility] or [facility]. Patient is hoping to travel to [place] and [place] to follow her ancestry once the pandemic gets better.  Return to clinic in1 yearfor follow-up   No orders of the defined types were placed in this encounter.  The patient has a good understanding of the overall plan. she agrees with it. she will call with any problems that may develop before the next visit here.  Total time spent: 20 mins including face to face time and time spent for planning, charting and coordination of care  Nicholas Lose, MD 05/24/2019  I, Cloyde Reams Dorshimer, am acting as scribe for Dr. Nicholas Lose.  I have reviewed the above documentation for accuracy and completeness, and I agree with the above.

## 2019-05-24 ENCOUNTER — Inpatient Hospital Stay: Payer: Medicare Other | Attending: Hematology and Oncology | Admitting: Hematology and Oncology

## 2019-05-24 ENCOUNTER — Encounter: Payer: Self-pay | Admitting: *Deleted

## 2019-05-24 ENCOUNTER — Ambulatory Visit (HOSPITAL_COMMUNITY): Payer: Medicare Other | Attending: Internal Medicine

## 2019-05-24 ENCOUNTER — Other Ambulatory Visit: Payer: Self-pay

## 2019-05-24 DIAGNOSIS — C50811 Malignant neoplasm of overlapping sites of right female breast: Secondary | ICD-10-CM | POA: Insufficient documentation

## 2019-05-24 DIAGNOSIS — C50912 Malignant neoplasm of unspecified site of left female breast: Secondary | ICD-10-CM | POA: Insufficient documentation

## 2019-05-24 DIAGNOSIS — Z79811 Long term (current) use of aromatase inhibitors: Secondary | ICD-10-CM | POA: Diagnosis not present

## 2019-05-24 DIAGNOSIS — R9431 Abnormal electrocardiogram [ECG] [EKG]: Secondary | ICD-10-CM | POA: Diagnosis not present

## 2019-05-24 DIAGNOSIS — Z9013 Acquired absence of bilateral breasts and nipples: Secondary | ICD-10-CM | POA: Insufficient documentation

## 2019-05-24 DIAGNOSIS — Z9884 Bariatric surgery status: Secondary | ICD-10-CM | POA: Diagnosis not present

## 2019-05-24 DIAGNOSIS — Z79899 Other long term (current) drug therapy: Secondary | ICD-10-CM | POA: Diagnosis not present

## 2019-05-24 DIAGNOSIS — Z170 Estrogen receptor positive status [ER+]: Secondary | ICD-10-CM | POA: Diagnosis not present

## 2019-05-24 DIAGNOSIS — C50812 Malignant neoplasm of overlapping sites of left female breast: Secondary | ICD-10-CM | POA: Diagnosis not present

## 2019-05-24 LAB — ECHOCARDIOGRAM COMPLETE
Height: 63 in
Weight: 2339.2 oz

## 2019-05-24 NOTE — Progress Notes (Signed)
Per MD request, referral successfully faxed to Dr. Iran Planas at [phone]

## 2019-05-24 NOTE — Assessment & Plan Note (Signed)
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

## 2019-05-30 ENCOUNTER — Other Ambulatory Visit: Payer: Self-pay | Admitting: *Deleted

## 2019-05-30 DIAGNOSIS — I517 Cardiomegaly: Secondary | ICD-10-CM

## 2019-06-03 NOTE — Progress Notes (Signed)
Cardiology Office Note:    Date:  06/04/2019   ID:  Rachel Castaneda, DOB 1945/09/21, MRN [id number]  PCP:  Ernestene Kiel, MD  Cardiologist:  No primary care provider on file.  Electrophysiologist:  None   Referring MD: Ernestene Kiel, MD   Chief complaint: Palpitations  History of Present Illness:    Rachel Castaneda is a 74 y.o. female with a hx of type 2 diabetes, hypertension, morbid obesity status post gastric bypass, hemochromatosis who presents for follow-up.  She was referred by Dr. Laqueta Due for evaluation of left anterior fascicular block, initially seen on 05/04/2019.  Reports that diabetes and hypertension resolved with weight loss following gastric bypass in 2000.  She lost 180 pounds following her gastric bypass.  She was taking iron supplements after bypass and was found to have elevated ferritin and ultimately diagnosed with hemochromatosis.  She undergoes periodic phlebotomy.  She reports that she walks for 45 to 90 minutes daily.  Denies any exertional chest pain.  Does have some dyspnea when walking up steep hills.  Reports occasional palpitations when very stressed, but occurs rarely.  TTE on 05/24/2019 showed normal biventricular function, no significant valvular disease, moderate asymmetric LVH.  Cardiac MRI was ordered.  Since last clinic visit, she reports that she has been doing well.  Denies any chest pain or dyspnea.  Does report that she had an episode of palpitations/ near syncope.  States that she had walked for 2 hours and was walking to her car.  Felt like heart paused, lasted few seconds.  Feels that if it had lasted longer she would have passed out.  Denies any episodes since that time.     Past Medical History:  Diagnosis Date  . Anemia   . Anxiety    pt. admits to anxiety, currently, not taking meds.   . Arthritis    lumbar region  . Diabetes mellitus    Type II, prior to gastric bypass , off Metformin since 2008  . Dysrhythmia    irreg heart  beats  . Fall    related to dog attack- Nov. 11.2013  . Family history of anesthesia complication    daughter- extremely agitated   . Headache(784.0)    h/o migraines   . HTN (hypertension)    no meds in 41yrs  . Hypertension    HTN- prior to gastric bypass, seen by cardiologist- stress test done, last one mid 1990's- pt. reports it was WNL.  Marland Kitchen Other specified intestinal malabsorption   . Sleep apnea    used CPAP- prior to gastric bypass, no longer needs.  Marland Kitchen UTI (urinary tract infection)   . Varicose veins     Past Surgical History:  Procedure Laterality Date  . ABDOMINAL HYSTERECTOMY  1989  . BACK SURGERY  2014   lumbar fusion  . CARPAL TUNNEL RELEASE     bilateral  . CESAREAN SECTION     x3  . CHOLECYSTECTOMY  2000   Gall Bladder  . COLONOSCOPY  2008  . CYSTECTOMY  2000   Sinus cyst  . GASTRIC BYPASS  2000  . [place]  and   2001  . MASTECTOMY W/ SENTINEL NODE BIOPSY Bilateral 07/14/2015   Procedure: BILATERAL TOTAL MASTECTOMIES WITH LEFT AXILLARY SENTINEL LYMPH NODE BIOPSY;  Surgeon: Rolm Bookbinder, MD;  Location: [facility];  Service: General;  Laterality: Bilateral;  . NASAL SINUS SURGERY    . TONSILLECTOMY      Current Medications: Current  Meds  Medication Sig  . acetaminophen (TYLENOL) 500 MG tablet Take by mouth.  . Biotin 1000 MCG tablet Take 1,000 mcg by mouth daily.   . Cyanocobalamin (VITAMIN B 12 PO) Take 2,500 mcg by mouth once a week.   Mariane Baumgarten Sodium (DSS) 100 MG CAPS Take by mouth.  . fexofenadine (ALLEGRA ALLERGY) 180 MG tablet   . hydrocortisone cream 1 % Apply topically.  . metroNIDAZOLE (METROGEL) 0.75 % gel Apply topically.  . Multiple Vitamin (MULTI-VITAMIN) tablet Take by mouth.  . nystatin cream (MYCOSTATIN) Apply topically.  . polyethylene glycol (MIRALAX / GLYCOLAX) 17 g packet Take 17 g by mouth daily.     Allergies:   Codeine, Baclofen, Dairy aid [lactase], Gabapentin, Ibuprofen, Topamax [topiramate],  Adhesive [tape], Nsaids, and Tramadol   Social History   Socioeconomic History  . Marital status: Divorced    Spouse name: Not on file  . Number of children: Not on file  . Years of education: Not on file  . Highest education level: Not on file  Occupational History  . Not on file  Tobacco Use  . Smoking status: Never Smoker  . Smokeless tobacco: Never Used  Substance and Sexual Activity  . Alcohol use: Yes    Comment: extremely rarely  . Drug use: No  . Sexual activity: Yes    Birth control/protection: Surgical  Other Topics Concern  . Not on file  Social History Narrative  . Not on file   Social Determinants of Health   Financial Resource Strain:   . Difficulty of Paying Living Expenses:   Food Insecurity:   . Worried About Charity fundraiser in the Last Year:   . Arboriculturist in the Last Year:   Transportation Needs:   . Film/video editor (Medical):   Marland Kitchen Lack of Transportation (Non-Medical):   Physical Activity:   . Days of Exercise per Week:   . Minutes of Exercise per Session:   Stress:   . Feeling of Stress :   Social Connections:   . Frequency of Communication with Friends and Family:   . Frequency of Social Gatherings with Friends and Family:   . Attends Religious Services:   . Active Member of Clubs or Organizations:   . Attends Archivist Meetings:   Marland Kitchen Marital Status:      Family History: The patient's family history includes Basal cell carcinoma in her daughter; Bladder Cancer (age of onset: 72) in her maternal grandmother; Breast cancer in her cousin; Diabetes in her paternal uncle; Heart attack in her paternal grandfather and paternal grandmother; Hypertension in her mother; Lupus in her daughter; Other in her mother; Ovarian cancer in her maternal aunt and maternal aunt; Skin cancer in her mother; Squamous cell carcinoma in her son; Stroke in her maternal aunt, mother, and paternal uncle; Uterine cancer in her maternal grandmother.   ROS:   Please see the history of present illness.     All other systems reviewed and are negative.  EKGs/Labs/Other Studies Reviewed:    The following studies were reviewed today:   EKG:  EKG is not ordered today.  The ekg ordered 05/04/2018 demonstrates normal sinus rhythm, rate 88, incomplete right bundle branch block, right axis deviation, low voltage in limb leads  Recent Labs: No results found for requested labs within last 8760 hours.  Recent Lipid Panel No results found for: CHOL, TRIG, HDL, CHOLHDL, VLDL, LDLCALC, LDLDIRECT  Physical Exam:    VS:  BP Marland Kitchen)  158/86   Pulse (!) 57   Ht 5' 2.75" (1.594 m)   Wt 142 lb 9.6 oz (64.7 kg)   SpO2 99%   BMI 25.46 kg/m     Wt Readings from Last 3 Encounters:  06/04/19 142 lb 9.6 oz (64.7 kg)  05/24/19 146 lb 3.2 oz (66.3 kg)  05/04/19 150 lb (68 kg)     GEN:  in no acute distress HEENT: Normal NECK: No JVD CARDIAC: RRR, no murmurs, rubs, gallops RESPIRATORY:  Clear to auscultation without rales, wheezing or rhonchi  ABDOMEN: Soft, non-tender, non-distended MUSCULOSKELETAL:  No edema; No deformity  SKIN: Warm and dry NEUROLOGIC:  Alert and oriented x 3 PSYCHIATRIC:  Normal affect   ASSESSMENT:    1. Palpitations   2. Hemochromatosis, unspecified hemochromatosis type   3. LVH (left ventricular hypertrophy)   4. Essential hypertension    PLAN:     Palpitations: Description concerning for arrhythmia, will check 30-day cardiac monitor  LVH: Moderate asymmetric LVH on TTE.  Differential includes hypertensive heart disease versus HCM.  Has known hemochromatosis, which typically causes dilated cardiomyopathy and not LVH.  Will evaluate further with cardiac MRI.  She has a history of multiple allergies and her daughter had recent significant reaction to gadolinium.  She has never received gadolinium.  States that she spoke with her allergist and recommended premedicating.  Will premedicate for MRI.  Hemochromatosis: Reports  has been well controlled, undergoes intermittent phlebotomy  Hypertension: Resolved with weight loss following gastric bypass.  Appears well controlled off medications currently  RTC in 3 months  Medication Adjustments/Labs and Tests Ordered: Current medicines are reviewed at length with the patient today.  Concerns regarding medicines are outlined above.  Orders Placed This Encounter  Procedures  . Basic metabolic panel  . Magnesium  . CARDIAC EVENT MONITOR   Meds ordered this encounter  Medications  . predniSONE (DELTASONE) 50 MG tablet    Sig: Take 1 tablet 13 hours prior to test, 1 tablet 7 hours prior to test, and 1 tablet 1 hour prior to test    Dispense:  3 tablet    Refill:  0    Patient Instructions  Medication Instructions:  Your physician recommends that you continue on your current medications as directed. Please refer to the Current Medication list given to you today.  Pre medication for MRI: you will need a ride after test due to benedryl 1. Prednisone 50 mg - take 13 hours prior to test 2. Take another Prednisone 50 mg 7 hours prior to test 3. Take another Prednisone 50 mg 1 hour prior to test 4. Take Benadryl 50 mg 1 hour prior to test    Testing: Your physician has recommended that you wear an event monitor. Event monitors are medical devices that record the heart's electrical activity. Doctors most often [place] these monitors to diagnose arrhythmias. Arrhythmias are problems with the speed or rhythm of the heartbeat. The monitor is a small, portable device. You can wear one while you do your normal daily activities. This is usually used to diagnose what is causing palpitations/syncope (passing out).  Lab Work: Art gallery manager, [place] today If you have labs (blood work) drawn today and your tests are completely normal, you will receive your results only by: Marland Kitchen MyChart Message (if you have MyChart) OR . A paper copy in the mail If you have any lab test that is abnormal or we  need to change your treatment, we will call you to review the  results.  Follow-Up: At [facility], you and your health needs are our priority.  As part of our continuing mission to provide you with exceptional heart care, we have created designated Provider Care Teams.  These Care Teams include your primary Cardiologist (physician) and Advanced Practice Providers (APPs -  Physician Assistants and Nurse Practitioners) who all work together to provide you with the care you need, when you need it.  We recommend signing up for the patient portal called "MyChart".  Sign up information is provided on this After Visit Summary.  MyChart is used to connect with patients for Virtual Visits (Telemedicine).  Patients are able to view lab/test results, encounter notes, upcoming appointments, etc.  Non-urgent messages can be sent to your provider as well.   To learn more about what you can do with MyChart, go to [email or web address].    Your next appointment:   3 month(s)  The format for your next appointment:   In Person  Provider:   Oswaldo Milian, MD        Signed, Donato Heinz, MD  06/04/2019 7:01 PM    [place]

## 2019-06-04 ENCOUNTER — Encounter: Payer: Self-pay | Admitting: Cardiology

## 2019-06-04 ENCOUNTER — Other Ambulatory Visit: Payer: Self-pay

## 2019-06-04 ENCOUNTER — Ambulatory Visit: Payer: Medicare Other | Admitting: Cardiology

## 2019-06-04 ENCOUNTER — Telehealth: Payer: Self-pay | Admitting: *Deleted

## 2019-06-04 VITALS — BP 158/86 | HR 57 | Ht 62.75 in | Wt 142.6 lb

## 2019-06-04 DIAGNOSIS — R002 Palpitations: Secondary | ICD-10-CM

## 2019-06-04 DIAGNOSIS — I10 Essential (primary) hypertension: Secondary | ICD-10-CM

## 2019-06-04 DIAGNOSIS — I517 Cardiomegaly: Secondary | ICD-10-CM | POA: Diagnosis not present

## 2019-06-04 MED ORDER — PREDNISONE 50 MG PO TABS: ORAL_TABLET | ORAL | 0 refills | Status: DC

## 2019-06-04 NOTE — Patient Instructions (Signed)
Medication Instructions:  Your physician recommends that you continue on your current medications as directed. Please refer to the Current Medication list given to you today.  Pre medication for MRI: you will need a ride after test due to benedryl 1. Prednisone 50 mg - take 13 hours prior to test 2. Take another Prednisone 50 mg 7 hours prior to test 3. Take another Prednisone 50 mg 1 hour prior to test 4. Take Benadryl 50 mg 1 hour prior to test    Testing: Your physician has recommended that you wear an event monitor. Event monitors are medical devices that record the heart's electrical activity. Doctors most often [place] these monitors to diagnose arrhythmias. Arrhythmias are problems with the speed or rhythm of the heartbeat. The monitor is a small, portable device. You can wear one while you do your normal daily activities. This is usually used to diagnose what is causing palpitations/syncope (passing out).  Lab Work: Art gallery manager, [place] today If you have labs (blood work) drawn today and your tests are completely normal, you will receive your results only by: Marland Kitchen MyChart Message (if you have MyChart) OR . A paper copy in the mail If you have any lab test that is abnormal or we need to change your treatment, we will call you to review the results.  Follow-Up: At [facility], you and your health needs are our priority.  As part of our continuing mission to provide you with exceptional heart care, we have created designated Provider Care Teams.  These Care Teams include your primary Cardiologist (physician) and Advanced Practice Providers (APPs -  Physician Assistants and Nurse Practitioners) who all work together to provide you with the care you need, when you need it.  We recommend signing up for the patient portal called "MyChart".  Sign up information is provided on this After Visit Summary.  MyChart is used to connect with patients for Virtual Visits (Telemedicine).  Patients are able to view  lab/test results, encounter notes, upcoming appointments, etc.  Non-urgent messages can be sent to your provider as well.   To learn more about what you can do with MyChart, go to [email or web address].    Your next appointment:   3 month(s)  The format for your next appointment:   In Person  Provider:   Oswaldo Milian, MD

## 2019-06-04 NOTE — Telephone Encounter (Signed)
Patient was in the office today for an appointment---she states she saw the information for her Cardiac MRI appt on My Chart

## 2019-06-05 ENCOUNTER — Telehealth: Payer: Self-pay | Admitting: Hematology and Oncology

## 2019-06-05 LAB — BASIC METABOLIC PANEL
BUN/Creatinine Ratio: 19 (ref 12–28)
BUN: 12 mg/dL (ref 8–27)
CO2: 23 mmol/L (ref 20–29)
Calcium: 9.8 mg/dL (ref 8.7–10.3)
Chloride: 101 mmol/L (ref 96–106)
Creatinine, Ser: 0.63 mg/dL (ref 0.57–1.00)
GFR calc Af Amer: 104 mL/min/{1.73_m2} (ref >59)
GFR calc non Af Amer: 90 mL/min/{1.73_m2} (ref >59)
Glucose: 98 mg/dL (ref 65–99)
Potassium: 4.5 mmol/L (ref 3.5–5.2)
Sodium: 142 mmol/L (ref 134–144)

## 2019-06-05 LAB — MAGNESIUM: Magnesium: 2 mg/dL (ref 1.6–2.3)

## 2019-06-05 NOTE — Telephone Encounter (Signed)
Scheduled per 04/15 los, patient has been called and notified. 

## 2019-06-07 ENCOUNTER — Ambulatory Visit (INDEPENDENT_AMBULATORY_CARE_PROVIDER_SITE_OTHER): Payer: Medicare Other

## 2019-06-07 DIAGNOSIS — Z9013 Acquired absence of bilateral breasts and nipples: Secondary | ICD-10-CM | POA: Diagnosis not present

## 2019-06-07 DIAGNOSIS — R002 Palpitations: Secondary | ICD-10-CM

## 2019-06-07 DIAGNOSIS — Z853 Personal history of malignant neoplasm of breast: Secondary | ICD-10-CM | POA: Diagnosis not present

## 2019-06-07 DIAGNOSIS — M793 Panniculitis, unspecified: Secondary | ICD-10-CM | POA: Diagnosis not present

## 2019-06-25 ENCOUNTER — Other Ambulatory Visit: Payer: Self-pay

## 2019-06-25 ENCOUNTER — Other Ambulatory Visit (HOSPITAL_COMMUNITY): Payer: Medicare Other

## 2019-06-25 ENCOUNTER — Ambulatory Visit (HOSPITAL_COMMUNITY)
Admission: RE | Admit: 2019-06-25 | Discharge: 2019-06-25 | Disposition: A | Discharge status: Home / Self Care | Payer: Medicare Other | Source: Ambulatory Visit | Attending: Cardiology | Admitting: Cardiology

## 2019-06-25 DIAGNOSIS — I517 Cardiomegaly: Secondary | ICD-10-CM | POA: Insufficient documentation

## 2019-06-25 IMAGING — MR MR CARD MORPHOLOGY WO/W CM
45 of 48 series · 45 of 48 positions shown · IV contrast (gadavist)
Comparison: none

CLINICAL DATA: 73F hemachromatosis, moderate LVH on TTE

EXAM:
CARDIAC MRI
TECHNIQUE: The patient was scanned on a 1.5 Tesla Siemens magnet. A dedicated
cardiac coil was used. Functional imaging was done using Fiesta
sequences. [DATE], and 4 chamber views were done to assess for RWMA's.
Modified KUNIHISA rule using a short axis stack was used to
calculate an ejection fraction on a dedicated work station using
Circle software. The patient received 8 cc of Gadavist. After 10
minutes inversion recovery sequences were used to assess for
infiltration and scar tissue.
CONTRAST:  8 cc  of Gadavist

[Series 9: bSSFP · oblique · 8.0mm · 1.52mm/px · 1 of 25 slices shown (1 of 26)]
[im 1/25]
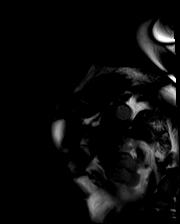

[Series 9: bSSFP · oblique · 8.0mm · 1.52mm/px · 1 of 25 slices shown (2 of 26)]
[im 1/25]
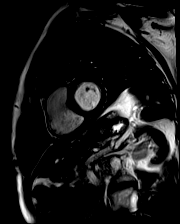

[Series 9: bSSFP · oblique · 8.0mm · 1.52mm/px · 1 of 25 slices shown (3 of 26)]
[im 1/25]
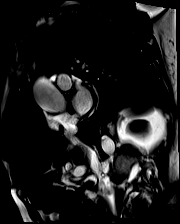

[Series 9: bSSFP · oblique · 8.0mm · 1.52mm/px · 1 of 25 slices shown (4 of 26)]
[im 1/25]
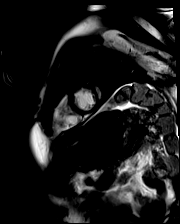

[Series 9: bSSFP · oblique · 8.0mm · 1.52mm/px · 1 of 25 slices shown (5 of 26)]
[im 1/25]
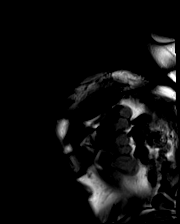

[Series 9: bSSFP · oblique · 8.0mm · 1.52mm/px · 1 of 25 slices shown (6 of 26)]
[im 1/25]
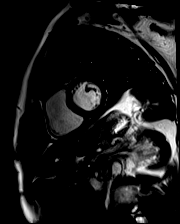

[Series 9: bSSFP · oblique · 8.0mm · 1.52mm/px · 1 of 25 slices shown (7 of 26)]
[im 1/25]
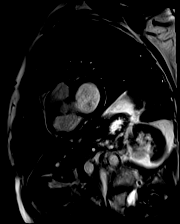

[Series 9: bSSFP · oblique · 8.0mm · 1.52mm/px · 1 of 25 slices shown (8 of 26)]
[im 1/25]
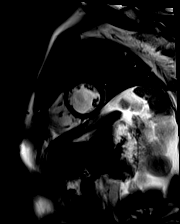

[Series 9: bSSFP · oblique · 8.0mm · 1.52mm/px · 1 of 25 slices shown (9 of 26)]
[im 1/25]
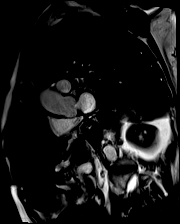

[Series 9: bSSFP · oblique · 8.0mm · 1.52mm/px · 1 of 25 slices shown (10 of 26)]
[im 1/25]
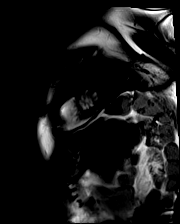

[Series 9: bSSFP · oblique · 8.0mm · 1.52mm/px · 1 of 25 slices shown (11 of 26)]
[im 1/25]
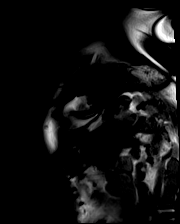

[Series 9: bSSFP · oblique · 8.0mm · 1.52mm/px · 1 of 25 slices shown (12 of 26)]
[im 1/25]
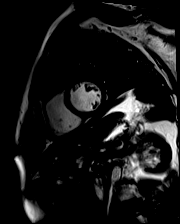

[Series 9: bSSFP · oblique · 8.0mm · 1.52mm/px · 1 of 25 slices shown (13 of 26)]
[im 1/25]
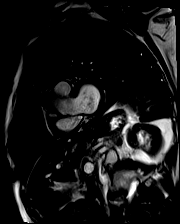

[Series 9: bSSFP · oblique · 8.0mm · 1.52mm/px · 1 of 25 slices shown (14 of 26)]
[im 1/25]
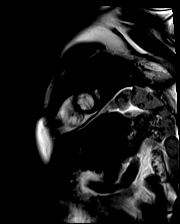

[Series 9: bSSFP · oblique · 8.0mm · 1.52mm/px · 1 of 25 slices shown (15 of 26)]
[im 1/25]
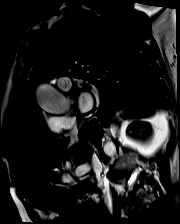

[Series 9: bSSFP · oblique · 8.0mm · 1.52mm/px · 1 of 25 slices shown (16 of 26)]
[im 1/25]
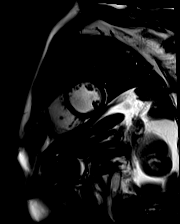

[Series 9: bSSFP · oblique · 8.0mm · 1.52mm/px · 1 of 25 slices shown (17 of 26)]
[im 1/25]
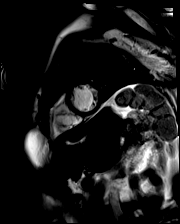

[Series 11: bSSFP · oblique · 6.0mm · 1.41mm/px · 1 of 25 slices shown (18 of 26)]
[im 1/25]
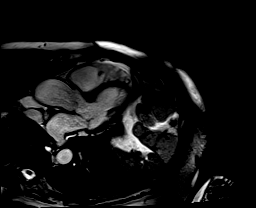

[Series 12: bSSFP · axial · 6.0mm · 1.41mm/px · 1 of 25 slices shown (19 of 26)]
[im 1/25]
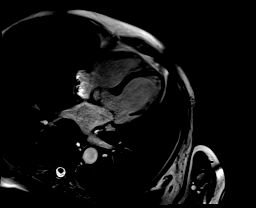

[Series 13: bSSFP · oblique · 6.0mm · 1.41mm/px · 1 of 25 slices shown (20 of 26)]
[im 1/25]
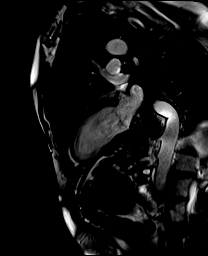

[Series 14: axial_db_haste_loc · axial · 7.0mm · 1.48mm/px · 1 of 20 slices shown]
[im 1/20]
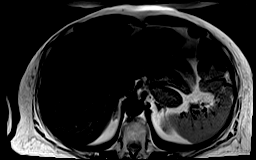

[Series 15: (id)_long_t1 · oblique · 8.0mm · 1.41mm/px · 1 of 24 slices shown]
[im 1/24]
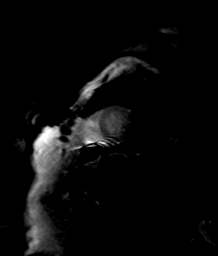

[Series 16: (id)_long_t1_moco · oblique · 8.0mm · 1.41mm/px · 1 of 24 slices shown]
[im 1/24]
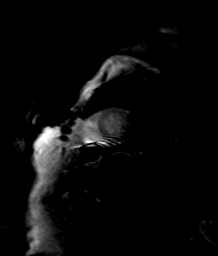

[Series 17: (id)_long_t1_moco_t1 · oblique · 8.0mm · 1.41mm/px · 1 of 5 slices shown]
[im 1/5]
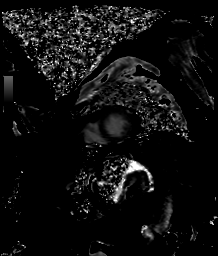

[Series 19: (id)_trufi · oblique · 8.0mm · 1.88mm/px · 1 of 9 slices shown]
[im 1/9]
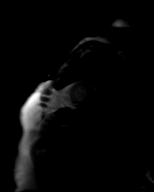

[Series 20: (id)_trufi_moco · oblique · 8.0mm · 1.88mm/px · 1 of 9 slices shown]
[im 1/9]
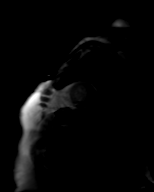

[Series 21: (id)_trufi_moco_t2 · oblique · 8.0mm · 1.88mm/px · 1 of 3 slices shown]
[im 1/3]
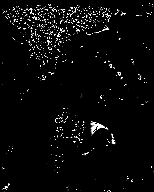

[Series 23: (id)_(id)_heart · oblique · 8.0mm · 1.56mm/px · 1 of 24 slices shown]
[im 1/24]
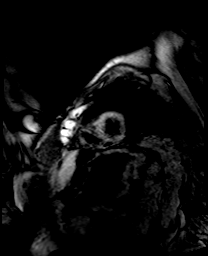

[Series 24: (id)_(id)_heart_(id) · oblique · 8.0mm · 1.56mm/px · 1 of 6 slices shown]
[im 1/6]
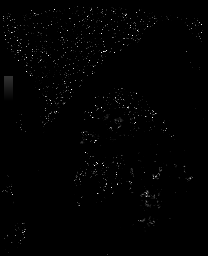

[Series 26: t2_stir_db_radial ((date)ch) · axial · 6.0mm · 1.73mm/px · 1 of 2 slices shown]
[im 1/2]
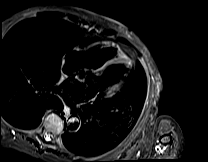

[Series 27: bSSFP · sagittal · 8.0mm · 1.61mm/px · 1 of 25 slices shown (21 of 26)]
[im 1/25]
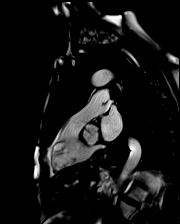

[Series 28: bSSFP · oblique · 8.0mm · 1.61mm/px · 1 of 25 slices shown (22 of 26)]
[im 1/25]
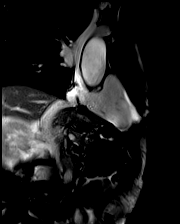

[Series 29: bSSFP · oblique · 8.0mm · 1.61mm/px · 1 of 25 slices shown (23 of 26)]
[im 1/25]
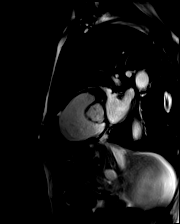

[Series 30: bSSFP · oblique · 8.0mm · 1.61mm/px · 1 of 25 slices shown (24 of 26)]
[im 1/25]
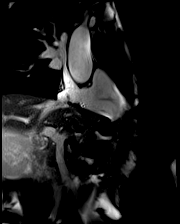

[Series 31: bSSFP · coronal · 8.0mm · 1.61mm/px · 1 of 25 slices shown (25 of 26)]
[im 1/25]
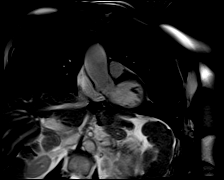

[Series 33: bSSFP · oblique · 8.0mm · 1.61mm/px · 1 of 25 slices shown (26 of 26)]
[im 1/25]
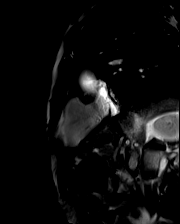

[Series 34: lge_single shot sa · oblique · 8.0mm · 1.98mm/px · 1 of 17 slices shown (1 of 2)]
[im 1/17]
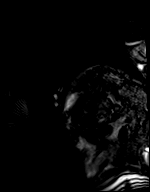

[Series 35: lge_single shot sa · oblique · 8.0mm · 1.98mm/px · 1 of 17 slices shown (2 of 2)]
[im 1/17]
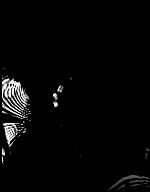

[Series 38: lge_single shot radial_mag · axial · 6.0mm · 1.98mm/px · 1 of 1 slices shown (1 of 2)]
[im 1/1]
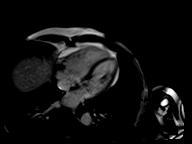

[Series 39: lge_single shot radial_psir · axial · 6.0mm · 1.98mm/px · 1 of 1 slices shown (1 of 2)]
[im 1/1]
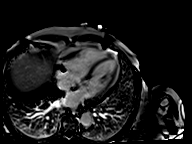

[Series 42: (id)_short_t1 · oblique · 8.0mm · 1.41mm/px · 1 of 27 slices shown]
[im 1/27]
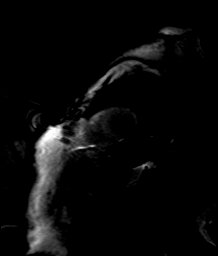

[Series 43: (id)_short_t1_moco · oblique · 8.0mm · 1.41mm/px · 1 of 27 slices shown]
[im 1/27]
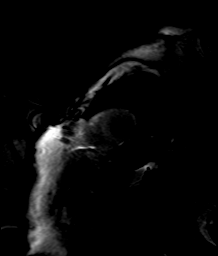

[Series 44: (id)_short_t1_moco_t1 · oblique · 8.0mm · 1.41mm/px · 1 of 5 slices shown]
[im 1/5]
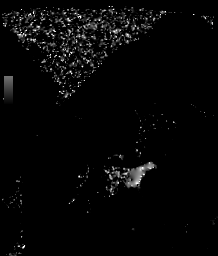

[Series 48: lge_single shot radial_mag · axial · 6.0mm · 1.98mm/px · 1 of 1 slices shown (2 of 2)]
[im 1/1]
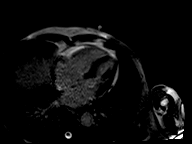

[Series 49: lge_single shot radial_psir · axial · 6.0mm · 1.98mm/px · 1 of 1 slices shown (2 of 2)]
[im 1/1]
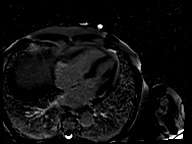

[45 of 48 positions shown; findings below may reference images not displayed]

FINDINGS: Left ventricle:

-Mild asymmetric hypertrophy measuring up to 11mm in basal septum
(7mm in posterior wall)

-Normal size

-Normal systolic function

-Normal ECV (27%) and native T1

-No LGE

LV EF:  69% (Normal 56-78%)

Absolute volumes:

LV EDV: 118mL (Normal 52-141 mL)

LV ESV: 36mL (Normal 13-51 mL)

LV SV: 82mL (Normal 33-97 mL)

CO: 6.1L/min (Normal 2.7-6.0 L/min)

Indexed volumes:

LV EDV: 70mL/sq-m (Normal 41-81 mL/sq-m)

LV ESV: 22mL/sq-m (Normal 12-21 mL/sq-m)

LV SV: 48mL/sq-m (Normal 26-56 mL/sq-m)

CI: 3.6L/min/sq-m (Normal 1.8-3.8 L/min/sq-m)

Right ventricle: Normal size and systolic function

RV EF: 63% (Normal 47-80%)

Absolute volumes:

RV EDV: 128mL (Normal 58-154 mL)

RV ESV: 47mL (Normal 12-68 mL)

RV SV: 82mL (Normal 35-98 mL)

CO: 6.1L/min (Normal 2.7-6 L/min)

Indexed volumes:

RV EDV: 76mL/sq-m (Normal 48-87 mL/sq-m)

RV ESV: 28mL/sq-m (Normal 11-28 mL/sq-m)

RV SV: 48mL/sq-m (Normal 27-57 mL/sq-m)

CI: 3.6L/min/sq-m (Normal 1.8-3.8 L/min/sq-m)

Left atrium: Mild enlargement

Right atrium: Normal size

Mitral valve: No regurgitation

Aortic valve: No regurgitation

Tricuspid valve: No regurgitation

Pulmonic valve: No regurgitation

Aorta: Dilated ascending aorta measuring up to 40mm

Pericardium: Normal
IMPRESSION: 1. Mild asymmetric hypertrophy measuring up to 11mm in basal septum
(7mm in posterior wall), not meeting criteria for hypertrophic
cardiomyopathy.

2. No late gadolinium enhancement

3. Normal LV size and systolic function (EF 69%)

4. Normal RV size and systolic function (EF 63%)

5. Dilated ascending aorta measuring up to 40mm

## 2019-06-25 MED ORDER — GADOBUTROL 1 MMOL/ML IV SOLN
8.0000 mL | Freq: Once | INTRAVENOUS | Status: AC | PRN
  Administered 2019-06-25: 8 mL via INTRAVENOUS

## 2019-06-26 ENCOUNTER — Other Ambulatory Visit: Payer: Self-pay | Admitting: *Deleted

## 2019-06-26 DIAGNOSIS — I712 Thoracic aortic aneurysm, without rupture, unspecified: Secondary | ICD-10-CM

## 2019-06-27 NOTE — Telephone Encounter (Signed)
Spoke with patient.  She reports that there was some difficulty in getting an IV for her MRI.  She did not note any pain or issues at the time of contrast administration.  However later that day noted the bruising in her arm.  States that she was having pain from this, but is improved today.  Normal strength/sensation in her hand.  She denies any fevers or chills.  Reports that swelling/bruising appears to be improving.  Suspect likely hematoma from IV, appears to be improving.  We will continue to monitor.  We also discussed the results of her MRI.  From a cardiac standpoint, no further cardiac work-up is needed prior to her surgery.

## 2019-07-19 DIAGNOSIS — H52223 Regular astigmatism, bilateral: Secondary | ICD-10-CM | POA: Diagnosis not present

## 2019-07-19 DIAGNOSIS — H5203 Hypermetropia, bilateral: Secondary | ICD-10-CM | POA: Diagnosis not present

## 2019-07-19 DIAGNOSIS — H353131 Nonexudative age-related macular degeneration, bilateral, early dry stage: Secondary | ICD-10-CM | POA: Diagnosis not present

## 2019-07-19 DIAGNOSIS — H25813 Combined forms of age-related cataract, bilateral: Secondary | ICD-10-CM | POA: Diagnosis not present

## 2019-08-02 DIAGNOSIS — M793 Panniculitis, unspecified: Secondary | ICD-10-CM | POA: Diagnosis not present

## 2019-08-02 DIAGNOSIS — Z853 Personal history of malignant neoplasm of breast: Secondary | ICD-10-CM | POA: Diagnosis not present

## 2019-08-02 DIAGNOSIS — Z9013 Acquired absence of bilateral breasts and nipples: Secondary | ICD-10-CM | POA: Diagnosis not present

## 2019-08-02 NOTE — H&P (Signed)
  Subjective:    Patient ID: Rachel Castaneda is a 74 y.o. female.  HPI  Here for follow up discussion prior to planned abdominal panniculectomy and excision soft tissue chest following mastectomies. Returns with daughter today who will help with post op care.  Underwent bilateral mastectomies 2017 with Dr. 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 Stopped anastrozole 2019 due to toxicities.   Genetics negative  Highest weight 330 lb Underwent gastric bypass 2000, lowest weight current. Had UHR prior to gastric bypass and notes developed abdominal hernia post surgery requiring open surgery with mesh placement. Notes approximately 40 lb wt fluctuation over last year. Reports pain over genitalia due to redundant soft tissue and has to hold area during activities such as brushing teeth. Intermittent rashes that she attempts to control with hygiene measures, topical HTC and antifungal creams.   Prior to mastectomies 42/44 D, current using knitted prostheses. Notes soft tissue redundancy lateral chest.  OSA, HTN, DM resolved with weight loss. She was taking iron supplements after bypass and was found to have elevated ferritin and ultimately diagnosed with hemochromatosis. She has required phlebotomy in past, last over a year. Had event monitor in place for work up palpitations, this was without significant abnormalities. cMRI 5.17.21 normal. 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 Improved on Claritin. Reported concern with breast implants and if she will develop reaction to them at initial consult for breast reconstruction options.  Review of Systems     Objective:  Physical Exam  Cardiovascular: Normal rate, regular rhythm and  normal heart sounds.  Pulmonary/Chest: Effort normal and breath sounds normal.  Neurological: She is alert.  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 Patient agrees to resection umbilicus with panniculectomy.  We reviewed areas of soft tissue redundancy chest, this is mainly lateral chest wall but desires some addition skin resection medial extent scars. Reviewed blood supply to abdominal wall following panniculectomy and expressed my concern that significant additional incisions over chest may compromise abdominal wall skin flaps. We agree to defer chest wall excision to another setting once healed from abdomen.  Reviewed panniculectomy will result in lift of mons, no significant change expected to labia majora.  Additional risks including but not limited to bleeding, seroma, hematoma, blood clots in legs or lungs, wound healing problems, infection, asymmetry, damage to adjacent structures, need for additional surgeries reviewed.  Patient has been completely immunized through [facility]. Discussed risk COVID infectionthrough this elective surgery. Patient will receive COVID testing prior to surgery. Discussed even if patient receivesa negative test result, the tests in some cases may fail to detect the virus or patient maycontract COVID after the test.COVID 19 infectionbefore/during/aftersurgery may result in lead to a higher chance of complication and death.  Plan overnight stay.  Rx for oxycodone given

## 2019-08-03 ENCOUNTER — Encounter (HOSPITAL_BASED_OUTPATIENT_CLINIC_OR_DEPARTMENT_OTHER): Payer: Self-pay | Admitting: Plastic Surgery

## 2019-08-03 ENCOUNTER — Other Ambulatory Visit: Payer: Self-pay

## 2019-08-10 ENCOUNTER — Other Ambulatory Visit (HOSPITAL_COMMUNITY)
Admission: RE | Admit: 2019-08-10 | Discharge: 2019-08-10 | Disposition: A | Discharge status: Home / Self Care | Payer: Medicare Other | Source: Ambulatory Visit | Attending: Plastic Surgery | Admitting: Plastic Surgery

## 2019-08-10 DIAGNOSIS — Z01812 Encounter for preprocedural laboratory examination: Secondary | ICD-10-CM | POA: Diagnosis not present

## 2019-08-10 DIAGNOSIS — Z20822 Contact with and (suspected) exposure to covid-19: Secondary | ICD-10-CM | POA: Insufficient documentation

## 2019-08-10 LAB — SARS CORONAVIRUS 2 (TAT 6-24 HRS): SARS Coronavirus 2: NEGATIVE

## 2019-08-10 NOTE — Progress Notes (Signed)

## 2019-08-14 ENCOUNTER — Other Ambulatory Visit: Payer: Self-pay

## 2019-08-14 ENCOUNTER — Encounter (HOSPITAL_BASED_OUTPATIENT_CLINIC_OR_DEPARTMENT_OTHER): Payer: Self-pay | Admitting: Plastic Surgery

## 2019-08-14 ENCOUNTER — Ambulatory Visit (HOSPITAL_BASED_OUTPATIENT_CLINIC_OR_DEPARTMENT_OTHER): Payer: Medicare Other | Admitting: Certified Registered"

## 2019-08-14 ENCOUNTER — Ambulatory Visit (HOSPITAL_BASED_OUTPATIENT_CLINIC_OR_DEPARTMENT_OTHER)
Admission: RE | Admit: 2019-08-14 | Discharge: 2019-08-15 | Disposition: A | Discharge status: Home / Self Care | Payer: Medicare Other | Attending: Plastic Surgery | Admitting: Plastic Surgery

## 2019-08-14 ENCOUNTER — Encounter (HOSPITAL_BASED_OUTPATIENT_CLINIC_OR_DEPARTMENT_OTHER)
Admission: RE | Disposition: A | Discharge status: Home / Self Care | Payer: Self-pay | Source: Home / Self Care | Attending: Plastic Surgery

## 2019-08-14 DIAGNOSIS — Z9013 Acquired absence of bilateral breasts and nipples: Secondary | ICD-10-CM | POA: Diagnosis not present

## 2019-08-14 DIAGNOSIS — D8940 Mast cell activation, unspecified: Secondary | ICD-10-CM | POA: Insufficient documentation

## 2019-08-14 DIAGNOSIS — Z853 Personal history of malignant neoplasm of breast: Secondary | ICD-10-CM | POA: Diagnosis not present

## 2019-08-14 DIAGNOSIS — R519 Headache, unspecified: Secondary | ICD-10-CM | POA: Diagnosis not present

## 2019-08-14 DIAGNOSIS — Z886 Allergy status to analgesic agent status: Secondary | ICD-10-CM | POA: Diagnosis not present

## 2019-08-14 DIAGNOSIS — Z888 Allergy status to other drugs, medicaments and biological substances status: Secondary | ICD-10-CM | POA: Insufficient documentation

## 2019-08-14 DIAGNOSIS — G4730 Sleep apnea, unspecified: Secondary | ICD-10-CM | POA: Insufficient documentation

## 2019-08-14 DIAGNOSIS — Z91048 Other nonmedicinal substance allergy status: Secondary | ICD-10-CM | POA: Insufficient documentation

## 2019-08-14 DIAGNOSIS — M1990 Unspecified osteoarthritis, unspecified site: Secondary | ICD-10-CM | POA: Diagnosis not present

## 2019-08-14 DIAGNOSIS — F419 Anxiety disorder, unspecified: Secondary | ICD-10-CM | POA: Diagnosis not present

## 2019-08-14 DIAGNOSIS — M793 Panniculitis, unspecified: Secondary | ICD-10-CM | POA: Diagnosis present

## 2019-08-14 DIAGNOSIS — Z79899 Other long term (current) drug therapy: Secondary | ICD-10-CM | POA: Diagnosis not present

## 2019-08-14 DIAGNOSIS — E119 Type 2 diabetes mellitus without complications: Secondary | ICD-10-CM | POA: Diagnosis not present

## 2019-08-14 DIAGNOSIS — Z885 Allergy status to narcotic agent status: Secondary | ICD-10-CM | POA: Insufficient documentation

## 2019-08-14 DIAGNOSIS — Z9884 Bariatric surgery status: Secondary | ICD-10-CM | POA: Insufficient documentation

## 2019-08-14 HISTORY — PX: PANNICULECTOMY: SHX5360

## 2019-08-14 LAB — GLUCOSE, CAPILLARY
Glucose-Capillary: 94 mg/dL (ref 70–99)
Glucose-Capillary: 103 mg/dL — ABNORMAL HIGH (ref 70–99)

## 2019-08-14 SURGERY — PANNICULECTOMY
Anesthesia: General | Site: Abdomen

## 2019-08-14 MED ORDER — ONDANSETRON 4 MG PO TBDP: 4.0000 mg | ORAL_TABLET | Freq: Four times a day (QID) | ORAL | Status: DC | PRN

## 2019-08-14 MED ORDER — ENOXAPARIN SODIUM 40 MG/0.4ML ~~LOC~~ SOLN
40.0000 mg | SUBCUTANEOUS | Status: DC
  Administered 2019-08-15: 40 mg via SUBCUTANEOUS
  Filled 2019-08-14: qty 0.4

## 2019-08-14 MED ORDER — ROCURONIUM BROMIDE 100 MG/10ML IV SOLN
INTRAVENOUS | Status: DC | PRN
  Administered 2019-08-14: 10 mg via INTRAVENOUS
  Administered 2019-08-14: 60 mg via INTRAVENOUS
  Administered 2019-08-14: 10 mg via INTRAVENOUS

## 2019-08-14 MED ORDER — HEPARIN SODIUM (PORCINE) 5000 UNIT/ML IJ SOLN
5000.0000 [IU] | Freq: Once | INTRAMUSCULAR | Status: AC
  Administered 2019-08-14: 5000 [IU] via SUBCUTANEOUS

## 2019-08-14 MED ORDER — HYDROMORPHONE HCL 1 MG/ML IJ SOLN: 0.5000 mg | INTRAMUSCULAR | Status: DC | PRN

## 2019-08-14 MED ORDER — MIDAZOLAM HCL 2 MG/2ML IJ SOLN
INTRAMUSCULAR | Status: AC
  Filled 2019-08-14: qty 2

## 2019-08-14 MED ORDER — FENTANYL CITRATE (PF) 100 MCG/2ML IJ SOLN
INTRAMUSCULAR | Status: DC | PRN
  Administered 2019-08-14: 100 ug via INTRAVENOUS
  Administered 2019-08-14 (×2): 25 ug via INTRAVENOUS
  Administered 2019-08-14: 50 ug via INTRAVENOUS

## 2019-08-14 MED ORDER — KCL IN DEXTROSE-NACL 20-5-0.45 MEQ/L-%-% IV SOLN
INTRAVENOUS | Status: DC
  Filled 2019-08-14: qty 1000

## 2019-08-14 MED ORDER — PROPOFOL 10 MG/ML IV BOLUS
INTRAVENOUS | Status: DC | PRN
  Administered 2019-08-14: 140 mg via INTRAVENOUS

## 2019-08-14 MED ORDER — ACETAMINOPHEN 325 MG RE SUPP: 650.0000 mg | Freq: Four times a day (QID) | RECTAL | Status: DC | PRN

## 2019-08-14 MED ORDER — 0.9 % SODIUM CHLORIDE (POUR BTL) OPTIME
TOPICAL | Status: DC | PRN
  Administered 2019-08-14: 500 mL

## 2019-08-14 MED ORDER — ONDANSETRON HCL 4 MG/2ML IJ SOLN
INTRAMUSCULAR | Status: DC | PRN
  Administered 2019-08-14: 4 mg via INTRAVENOUS

## 2019-08-14 MED ORDER — FENTANYL CITRATE (PF) 100 MCG/2ML IJ SOLN
25.0000 ug | INTRAMUSCULAR | Status: DC | PRN
  Administered 2019-08-14 (×3): 50 ug via INTRAVENOUS

## 2019-08-14 MED ORDER — DIPHENHYDRAMINE HCL 12.5 MG/5ML PO ELIX: 12.5000 mg | ORAL_SOLUTION | Freq: Four times a day (QID) | ORAL | Status: DC | PRN

## 2019-08-14 MED ORDER — LORATADINE 10 MG PO TABS
10.0000 mg | ORAL_TABLET | Freq: Every day | ORAL | Status: DC
  Filled 2019-08-14: qty 1

## 2019-08-14 MED ORDER — DEXAMETHASONE SODIUM PHOSPHATE 4 MG/ML IJ SOLN
INTRAMUSCULAR | Status: DC | PRN
  Administered 2019-08-14: 5 mg via INTRAVENOUS

## 2019-08-14 MED ORDER — ROCURONIUM BROMIDE 10 MG/ML (PF) SYRINGE
PREFILLED_SYRINGE | INTRAVENOUS | Status: AC
  Filled 2019-08-14: qty 10

## 2019-08-14 MED ORDER — FENTANYL CITRATE (PF) 100 MCG/2ML IJ SOLN
INTRAMUSCULAR | Status: AC
  Filled 2019-08-14: qty 2

## 2019-08-14 MED ORDER — HEPARIN SODIUM (PORCINE) 5000 UNIT/ML IJ SOLN
INTRAMUSCULAR | Status: AC
  Filled 2019-08-14: qty 1

## 2019-08-14 MED ORDER — LIDOCAINE 2% (20 MG/ML) 5 ML SYRINGE
INTRAMUSCULAR | Status: AC
  Filled 2019-08-14: qty 5

## 2019-08-14 MED ORDER — CEFAZOLIN SODIUM-DEXTROSE 2-4 GM/100ML-% IV SOLN
INTRAVENOUS | Status: AC
  Filled 2019-08-14: qty 100

## 2019-08-14 MED ORDER — CEFAZOLIN SODIUM-DEXTROSE 2-4 GM/100ML-% IV SOLN
2.0000 g | INTRAVENOUS | Status: AC
  Administered 2019-08-14: 2 g via INTRAVENOUS

## 2019-08-14 MED ORDER — ONDANSETRON HCL 4 MG/2ML IJ SOLN
INTRAMUSCULAR | Status: AC
  Filled 2019-08-14: qty 2

## 2019-08-14 MED ORDER — ACETAMINOPHEN 500 MG PO TABS
ORAL_TABLET | ORAL | Status: AC
  Filled 2019-08-14: qty 2

## 2019-08-14 MED ORDER — DEXAMETHASONE SODIUM PHOSPHATE 10 MG/ML IJ SOLN
INTRAMUSCULAR | Status: AC
  Filled 2019-08-14: qty 1

## 2019-08-14 MED ORDER — DIPHENHYDRAMINE HCL 50 MG/ML IJ SOLN: 12.5000 mg | Freq: Four times a day (QID) | INTRAMUSCULAR | Status: DC | PRN

## 2019-08-14 MED ORDER — EPHEDRINE SULFATE 50 MG/ML IJ SOLN
INTRAMUSCULAR | Status: DC | PRN
  Administered 2019-08-14: 10 mg via INTRAVENOUS

## 2019-08-14 MED ORDER — LIDOCAINE HCL (CARDIAC) PF 100 MG/5ML IV SOSY
PREFILLED_SYRINGE | INTRAVENOUS | Status: DC | PRN
  Administered 2019-08-14: 40 mg via INTRAVENOUS

## 2019-08-14 MED ORDER — BUPIVACAINE HCL (PF) 0.5 % IJ SOLN
INTRAMUSCULAR | Status: AC
  Filled 2019-08-14: qty 30

## 2019-08-14 MED ORDER — ACETAMINOPHEN 500 MG PO TABS
1000.0000 mg | ORAL_TABLET | ORAL | Status: AC
  Administered 2019-08-14: 1000 mg via ORAL

## 2019-08-14 MED ORDER — LACTATED RINGERS IV SOLN: INTRAVENOUS | Status: DC

## 2019-08-14 MED ORDER — SUGAMMADEX SODIUM 200 MG/2ML IV SOLN
INTRAVENOUS | Status: DC | PRN
  Administered 2019-08-14: 100 mg via INTRAVENOUS

## 2019-08-14 MED ORDER — CHLORHEXIDINE GLUCONATE CLOTH 2 % EX PADS: 6.0000 | MEDICATED_PAD | Freq: Once | CUTANEOUS | Status: DC

## 2019-08-14 MED ORDER — ONDANSETRON HCL 4 MG/2ML IJ SOLN: 4.0000 mg | Freq: Four times a day (QID) | INTRAMUSCULAR | Status: DC | PRN

## 2019-08-14 MED ORDER — ACETAMINOPHEN 325 MG PO TABS: 650.0000 mg | ORAL_TABLET | Freq: Four times a day (QID) | ORAL | Status: DC | PRN

## 2019-08-14 MED ORDER — PROPOFOL 500 MG/50ML IV EMUL
INTRAVENOUS | Status: AC
  Filled 2019-08-14: qty 50

## 2019-08-14 MED ORDER — MIDAZOLAM HCL 5 MG/5ML IJ SOLN
INTRAMUSCULAR | Status: DC | PRN
  Administered 2019-08-14: 1 mg via INTRAVENOUS

## 2019-08-14 MED ORDER — BUPIVACAINE HCL (PF) 0.5 % IJ SOLN
INTRAMUSCULAR | Status: DC | PRN
  Administered 2019-08-14: 30 mL

## 2019-08-14 MED ORDER — OXYCODONE HCL 5 MG PO TABS
5.0000 mg | ORAL_TABLET | ORAL | Status: DC | PRN
  Administered 2019-08-14 – 2019-08-15 (×3): 5 mg via ORAL
  Administered 2019-08-15: 10 mg via ORAL
  Filled 2019-08-14: qty 2
  Filled 2019-08-14 (×3): qty 1

## 2019-08-14 MED ORDER — BIOTIN 1000 MCG PO TABS: 1000.0000 ug | ORAL_TABLET | Freq: Every day | ORAL | Status: DC

## 2019-08-14 SURGICAL SUPPLY — 62 items
APPLIER CLIP 9.375 MED OPEN (MISCELLANEOUS) ×4
BINDER ABDOMINAL 10 UNV 27-48 (MISCELLANEOUS) IMPLANT
BINDER ABDOMINAL 12 SM 30-45 (SOFTGOODS) IMPLANT
BLADE CLIPPER SURG (BLADE) IMPLANT
BLADE SURG 10 STRL SS (BLADE) ×6 IMPLANT
BLADE SURG 11 STRL SS (BLADE) IMPLANT
BLADE SURG 15 STRL LF DISP TIS (BLADE) IMPLANT
BLADE SURG 15 STRL SS (BLADE)
CANISTER SUCT 1200ML W/VALVE (MISCELLANEOUS) ×2 IMPLANT
CHLORAPREP W/TINT 26 (MISCELLANEOUS) ×4 IMPLANT
CLIP APPLIE 9.375 MED OPEN (MISCELLANEOUS) ×2 IMPLANT
COVER BACK TABLE 60X90IN (DRAPES) ×2 IMPLANT
COVER MAYO STAND STRL (DRAPES) ×2 IMPLANT
COVER SURGICAL LIGHT HANDLE (MISCELLANEOUS) ×2 IMPLANT
COVER WAND RF STERILE (DRAPES) IMPLANT
DERMABOND ADVANCED (GAUZE/BANDAGES/DRESSINGS) ×3
DERMABOND ADVANCED .7 DNX12 (GAUZE/BANDAGES/DRESSINGS) ×3 IMPLANT
DRAIN CHANNEL 15F RND FF W/TCR (WOUND CARE) ×4 IMPLANT
DRAIN CHANNEL 19F RND (DRAIN) IMPLANT
DRAPE TOP ARMCOVERS (MISCELLANEOUS) ×2 IMPLANT
DRAPE U-SHAPE 76X120 STRL (DRAPES) ×2 IMPLANT
DRAPE UTILITY XL STRL (DRAPES) ×4 IMPLANT
DRSG PAD ABDOMINAL 8X10 ST (GAUZE/BANDAGES/DRESSINGS) ×6 IMPLANT
ELECT BLADE 4.0 EZ CLEAN MEGAD (MISCELLANEOUS)
ELECT COATED BLADE 2.86 ST (ELECTRODE) ×2 IMPLANT
ELECT REM PT RETURN 9FT ADLT (ELECTROSURGICAL) ×2
ELECTRODE BLDE 4.0 EZ CLN MEGD (MISCELLANEOUS) IMPLANT
ELECTRODE REM PT RTRN 9FT ADLT (ELECTROSURGICAL) ×1 IMPLANT
EVACUATOR SILICONE 100CC (DRAIN) ×4 IMPLANT
GAUZE XEROFORM 1X8 LF (GAUZE/BANDAGES/DRESSINGS) IMPLANT
GLOVE BIO SURGEON STRL SZ 6 (GLOVE) ×4 IMPLANT
GLOVE BIOGEL PI IND STRL 6.5 (GLOVE) ×1 IMPLANT
GLOVE BIOGEL PI IND STRL 7.0 (GLOVE) ×2 IMPLANT
GLOVE BIOGEL PI INDICATOR 6.5 (GLOVE) ×1
GLOVE BIOGEL PI INDICATOR 7.0 (GLOVE) ×2
GLOVE ECLIPSE 6.5 STRL STRAW (GLOVE) ×4 IMPLANT
GOWN STRL REUS W/ TWL LRG LVL3 (GOWN DISPOSABLE) ×2 IMPLANT
GOWN STRL REUS W/TWL LRG LVL3 (GOWN DISPOSABLE) ×2
NEEDLE HYPO 25X1 1.5 SAFETY (NEEDLE) ×2 IMPLANT
NS IRRIG 1000ML POUR BTL (IV SOLUTION) ×2 IMPLANT
PACK BASIN DAY SURGERY FS (CUSTOM PROCEDURE TRAY) ×2 IMPLANT
PENCIL SMOKE EVACUATOR (MISCELLANEOUS) ×2 IMPLANT
PIN SAFETY STERILE (MISCELLANEOUS) ×2 IMPLANT
SHEET MEDIUM DRAPE 40X70 STRL (DRAPES) ×4 IMPLANT
SLEEVE SCD COMPRESS KNEE MED (MISCELLANEOUS) ×2 IMPLANT
SPONGE LAP 18X18 RF (DISPOSABLE) ×4 IMPLANT
STAPLER VISISTAT 35W (STAPLE) ×2 IMPLANT
SUT ETHILON 2 0 FS 18 (SUTURE) ×2 IMPLANT
SUT MNCRL AB 4-0 PS2 18 (SUTURE) ×4 IMPLANT
SUT PDS AB 0 CT 36 (SUTURE) ×4 IMPLANT
SUT PDS AB 2-0 CT2 27 (SUTURE) ×2 IMPLANT
SUT PLAIN 5 0 P 3 18 (SUTURE) IMPLANT
SUT VLOC 180 0 24IN GS25 (SUTURE) ×4 IMPLANT
SYR BULB IRRIG 60ML STRL (SYRINGE) ×2 IMPLANT
SYR CONTROL 10ML LL (SYRINGE) ×2 IMPLANT
TOWEL GREEN STERILE FF (TOWEL DISPOSABLE) ×2 IMPLANT
TRAY FOL W/BAG SLVR 16FR STRL (SET/KITS/TRAYS/PACK) IMPLANT
TRAY FOLEY W/BAG SLVR 14FR LF (SET/KITS/TRAYS/PACK) ×2 IMPLANT
TRAY FOLEY W/BAG SLVR 16FR LF (SET/KITS/TRAYS/PACK)
TUBE CONNECTING 20X1/4 (TUBING) ×2 IMPLANT
UNDERPAD 30X36 HEAVY ABSORB (UNDERPADS AND DIAPERS) ×4 IMPLANT
YANKAUER SUCT BULB TIP NO VENT (SUCTIONS) ×2 IMPLANT

## 2019-08-14 NOTE — Transfer of Care (Signed)
Immediate Anesthesia Transfer of Care Note  Patient: Rachel Castaneda  Procedure(s) Performed: ABDOMINAL PANNICULECTOMY (N/A Abdomen)  Patient Location: PACU  Anesthesia Type:General  Level of Consciousness: sedated  Airway & Oxygen Therapy: Patient Spontanous Breathing and Patient connected to face mask oxygen  Post-op Assessment: Report given to RN and Post -op Vital signs reviewed and stable  Post vital signs: Reviewed and stable  Last Vitals:  Vitals Value Taken Time  BP    Temp    Pulse 71 08/14/19 1106  Resp 15 08/14/19 1106  SpO2 100 % 08/14/19 1106  Vitals shown include unvalidated device data.  Last Pain:  Vitals:   08/14/19 0655  TempSrc: Oral  PainSc: 0-No pain      Patients Stated Pain Goal: 5 (36/85/99 2341)  Complications: No complications documented.

## 2019-08-14 NOTE — Interval H&P Note (Signed)
History and Physical Interval Note:  08/14/2019 6:47 AM  Rachel Castaneda  has presented today for surgery, with the diagnosis of panniculitis, history breast cancer, acquired absence breasts.  The various methods of treatment have been discussed with the patient and family. After consideration of risks, benefits and other options for treatment, the patient has consented to  Procedure(s): ABDOMINAL PANNICULECTOMY (N/A) as a surgical intervention.  The patient's history has been reviewed, patient examined, no change in status, stable for surgery.  I have reviewed the patient's chart and labs.  Questions were answered to the patient's satisfaction.     Arnoldo Hooker Mikinzie Maciejewski

## 2019-08-14 NOTE — Anesthesia Postprocedure Evaluation (Signed)
Anesthesia Post Note  Patient: Rachel Castaneda  Procedure(s) Performed: ABDOMINAL PANNICULECTOMY (N/A Abdomen)     Patient location during evaluation: PACU Anesthesia Type: General Level of consciousness: awake and alert Pain management: pain level controlled Vital Signs Assessment: post-procedure vital signs reviewed and stable Respiratory status: spontaneous breathing, nonlabored ventilation, respiratory function stable and patient connected to nasal cannula oxygen Cardiovascular status: blood pressure returned to baseline and stable Postop Assessment: no apparent nausea or vomiting Anesthetic complications: no   No complications documented.  Last Vitals:  Vitals:   08/14/19 1214 08/14/19 1220  BP:  (!) 142/82  Pulse: 65 71  Resp: 12 16  Temp: 36.7 C (!) 36.2 C  SpO2: 100% 96%    Last Pain:  Vitals:   08/14/19 1415  TempSrc:   PainSc: 0-No pain                 Effie Berkshire

## 2019-08-14 NOTE — Op Note (Signed)
Operative Note   DATE OF OPERATION: 7.6.21  LOCATION:  [facility]  SURGICAL DIVISION: Plastic Surgery  PREOPERATIVE DIAGNOSES:  1. Panniculitis 2. History gastric bypass  POSTOPERATIVE DIAGNOSES:  same  PROCEDURE:  Abdominal panniculectomy  SURGEON: Irene Limbo MD MBA  ASSISTANT: none  ANESTHESIA:  General.   EBL: 50 ml  COMPLICATIONS: None immediate.   INDICATIONS FOR PROCEDURE:  The patient, Rachel Castaneda, is a 74 y.o. female born on 1945/07/07, is here for excision panniculus following gastric bypass with over 100 lb weight loss, recurrent panniculitis that has failed conservative measures.   FINDINGS: 2134 g resection abdomen. History umbilical and ventral hernia repair; no exposure mesh noted intraoperatively. No evidence hernia. 6-9 cm diastasis rectii noted intraoperatively.  DESCRIPTION OF PROCEDURE:  The patient's operative site was marked with the patient in the preoperative area. SQ heparin administered. The patient was taken to the operating room. SCDs were placed and IV antibiotics were given. The patient was taken to the operating room. Foley catheter placed.The patient's operative site was prepped and draped in a sterile fashion. A time out was performed and all information was confirmed to be correct.   Caudal incisionmarked 7 cm from labial fourchette and extended over lateral abdomen.Low transverseabdominalincision made and carried through superficial fascia to abdominal wall. Skin flap elevated in sub Scarpa's layer, taking care to leave layer of subfascial fat over abdominal wall fascia. Dissection completed toward umbilicus. Umbilicus excised. Additional dissection completed in midline toward xiphoid. Wound irrigated and hemostasis obtained. Local anesthetic infiltrated.15 Fr JP placed in subcutaneousright and leftabdomen and secured with 2-0 nylon. Diastasis recti imbricated with 0 V lock suture. Patient then brought to semi  sitting position.Caudal extent skin excision marked by palpation.Area marked excised. Low transverseabdominalskin incision closed with 0 PDS in superficial fascia. 0 V lock used to closedermis and 4-0 monocryl for subcuticular skin closure. Dermabond applied.  Dry dressingand abdominal binderapplied.The patient was allowed to wakefromanesthesia, extubatedand taken to the recovery room in satisfactory condition.   SPECIMENS: none  DRAINS: 15 Fr JP in right and left subcutaneous abdomen

## 2019-08-14 NOTE — Anesthesia Preprocedure Evaluation (Addendum)
Anesthesia Evaluation  Patient identified by MRN, date of birth, ID band Patient awake    Reviewed: Allergy & Precautions, NPO status , Patient's Chart, lab work & pertinent test results  Airway Mallampati: I  TM Distance: >3 FB Neck ROM: Full    Dental  (+) Teeth Intact, Dental Advisory Given   Pulmonary sleep apnea ,    breath sounds clear to auscultation       Cardiovascular hypertension, + dysrhythmias  Rhythm:Regular Rate:Normal     Neuro/Psych  Headaches, Anxiety    GI/Hepatic negative GI ROS, Neg liver ROS,   Endo/Other  diabetes  Renal/GU negative Renal ROS     Musculoskeletal  (+) Arthritis ,   Abdominal Normal abdominal exam  (+)   Peds  Hematology negative hematology ROS (+)   Anesthesia Other Findings   Reproductive/Obstetrics                           Anesthesia Physical Anesthesia Plan  ASA: II  Anesthesia Plan: General   Post-op Pain Management:    Induction: Intravenous  PONV Risk Score and Plan: 4 or greater and Ondansetron, Dexamethasone and Treatment may vary due to age or medical condition  Airway Management Planned: Oral ETT  Additional Equipment: None  Intra-op Plan:   Post-operative Plan: Extubation in OR  Informed Consent: I have reviewed the patients History and Physical, chart, labs and discussed the procedure including the risks, benefits and alternatives for the proposed anesthesia with the patient or authorized representative who has indicated his/her understanding and acceptance.     Dental advisory given  Plan Discussed with: CRNA  Anesthesia Plan Comments: (Echo:  1. Left ventricular ejection fraction, by estimation, is 55 to 60%. The  left ventricle has normal function. The left ventricle has no regional  wall motion abnormalities. There is moderate asymmetric left ventricular  hypertrophy, 14 mm at basal septal  segment. No left  ventricular outflow tract obstruction. Left ventricular  diastolic parameters were normal.  2. Right ventricular systolic function is normal. The right ventricular  size is normal. There is normal pulmonary artery systolic pressure. The  estimated right ventricular systolic pressure is 76.2 mmHg.  3. The mitral valve is normal in structure. Trivial mitral valve  regurgitation. No evidence of mitral stenosis.  4. The aortic valve is tricuspid. Aortic valve regurgitation is not  visualized. No aortic stenosis is present.  5. Aortic dilatation noted. There is borderline dilatation of the  ascending aorta measuring 38 mm.  6. The inferior vena cava is normal in size with greater than 50%  respiratory variability, suggesting right atrial pressure of 3 mmHg. )       Anesthesia Quick Evaluation

## 2019-08-14 NOTE — Anesthesia Procedure Notes (Signed)
Procedure Name: Intubation Performed by: Jaevin Medearis, [place], CRNA Pre-anesthesia Checklist: Patient identified, Emergency Drugs available, Suction available and Patient being monitored Patient Re-evaluated:Patient Re-evaluated prior to induction Oxygen Delivery Method: Circle system utilized Preoxygenation: Pre-oxygenation with 100% oxygen Induction Type: IV induction Ventilation: Mask ventilation without difficulty Laryngoscope Size: Mac and 3 Grade View: Grade I Tube type: Oral Tube size: 7.0 mm Number of attempts: 1 Airway Equipment and Method: Stylet and Oral airway Placement Confirmation: ETT inserted through vocal cords under direct vision,  positive ETCO2 and breath sounds checked- equal and bilateral Tube secured with: Tape Dental Injury: Teeth and Oropharynx as per pre-operative assessment        

## 2019-08-15 DIAGNOSIS — Z885 Allergy status to narcotic agent status: Secondary | ICD-10-CM | POA: Diagnosis not present

## 2019-08-15 DIAGNOSIS — R519 Headache, unspecified: Secondary | ICD-10-CM | POA: Diagnosis not present

## 2019-08-15 DIAGNOSIS — Z79899 Other long term (current) drug therapy: Secondary | ICD-10-CM | POA: Diagnosis not present

## 2019-08-15 DIAGNOSIS — G4730 Sleep apnea, unspecified: Secondary | ICD-10-CM | POA: Diagnosis not present

## 2019-08-15 DIAGNOSIS — Z853 Personal history of malignant neoplasm of breast: Secondary | ICD-10-CM | POA: Diagnosis not present

## 2019-08-15 DIAGNOSIS — M793 Panniculitis, unspecified: Secondary | ICD-10-CM | POA: Diagnosis not present

## 2019-08-15 DIAGNOSIS — Z886 Allergy status to analgesic agent status: Secondary | ICD-10-CM | POA: Diagnosis not present

## 2019-08-15 DIAGNOSIS — Z9884 Bariatric surgery status: Secondary | ICD-10-CM | POA: Diagnosis not present

## 2019-08-15 DIAGNOSIS — M1990 Unspecified osteoarthritis, unspecified site: Secondary | ICD-10-CM | POA: Diagnosis not present

## 2019-08-15 DIAGNOSIS — F419 Anxiety disorder, unspecified: Secondary | ICD-10-CM | POA: Diagnosis not present

## 2019-08-15 DIAGNOSIS — Z91048 Other nonmedicinal substance allergy status: Secondary | ICD-10-CM | POA: Diagnosis not present

## 2019-08-15 DIAGNOSIS — D8940 Mast cell activation, unspecified: Secondary | ICD-10-CM | POA: Diagnosis not present

## 2019-08-15 DIAGNOSIS — Z888 Allergy status to other drugs, medicaments and biological substances status: Secondary | ICD-10-CM | POA: Diagnosis not present

## 2019-08-15 NOTE — Discharge Summary (Signed)
Physician Discharge Summary  Patient ID: Rachel Castaneda MRN: [id number] DOB/AGE: 09-Apr-1945 74 y.o.  Admit date: 08/14/2019 Discharge date: 08/15/2019  Admission Diagnoses: Panniculitis  Discharge Diagnoses:  Active Problems:   Panniculitis   Discharged Condition: stable  Hospital Course: Post operatively patient did well tolerating diet, pain controlled, ambulating with minimal assist. Patient instructed on bathing and drain care.  Treatments: surgery: panniculectomy 7.6.21  Discharge Exam: Blood pressure 133/71, pulse 78, temperature 99 F (37.2 C), resp. rate 16, height 5' 2.75" (1.594 m), weight 62 kg, SpO2 97 %. Incision/Wound: intact drains serosanguinous abdomen soft  Disposition: Discharge disposition: 01-Home or Self Care       Discharge Instructions    Call MD for:  redness, tenderness, or signs of infection (pain, swelling, bleeding, redness, odor or green/yellow discharge around incision site)   Complete by: As directed    Call MD for:  temperature >100.5   Complete by: As directed    Discharge instructions   Complete by: As directed    Ok to remove dressings and shower am 7.8.21. Soap and water ok, pat incisions dry. No creams or ointments over incisions. Do not let drains dangle in shower, attach to lanyard or similar.Strip and record drains twice daily and bring log to clinic visit.  Abdominal binder or compression garment all other times.  Walk bent at hip through follow up visit; do not stand straight. Sleep/recline with head elevated on 2-3 pillows and pillow beneath knees. No house yard work or exercise until cleared by MD.    Patient received Rx prior to surgery. Also ok to use Tylenol for pain. Recommend Miralax or Dulcolax as needed for constipation.   Driving Restrictions   Complete by: As directed    No driving if taking narcotics   Lifting restrictions   Complete by: As directed    No lifting >5-10 lbs until cleared by MD   Resume previous  diet   Complete by: As directed      Allergies as of 08/15/2019      Reactions   Codeine Hives   Baclofen Other (See Comments)   Nightmares   Dairy Aid [lactase]    Gassy, rash   Gabapentin    Weight gain/ severe depression and memory loss   Ibuprofen Itching, Swelling   Topamax [topiramate]    Depressed, fuzzy feeling   Adhesive [tape] Rash   Redness/    Nsaids Rash   Tramadol Rash      Medication List    TAKE these medications   acetaminophen 500 MG tablet Commonly known as: TYLENOL Take by mouth.   Allegra Allergy 180 MG tablet Generic drug: fexofenadine   Biotin 1000 MCG tablet Take 1,000 mcg by mouth daily.   DSS 100 MG Caps Take by mouth.   hydrocortisone cream 1 % Apply topically.   metroNIDAZOLE 0.75 % gel Commonly known as: METROGEL Apply topically.   Multi-Vitamin tablet Take by mouth.   nystatin cream Commonly known as: MYCOSTATIN Apply topically.   polyethylene glycol 17 g packet Commonly known as: MIRALAX / GLYCOLAX Take 17 g by mouth daily.   VITAMIN B 12 PO Take 2,500 mcg by mouth once a week.       Follow-up Information    Irene Limbo, MD In 1 week.   Specialty: Plastic Surgery Why: as scheduled Contact information: [address] [place] [place] [address] [phone]               Signed:  Esker Dever 08/15/2019, 7:41 AM

## 2019-08-15 NOTE — Discharge Instructions (Signed)

## 2019-08-16 ENCOUNTER — Encounter (HOSPITAL_BASED_OUTPATIENT_CLINIC_OR_DEPARTMENT_OTHER): Payer: Self-pay | Admitting: Plastic Surgery

## 2019-09-04 ENCOUNTER — Ambulatory Visit: Payer: Medicare Other | Admitting: Cardiology

## 2019-09-17 ENCOUNTER — Encounter: Payer: Self-pay | Admitting: Hematology and Oncology

## 2019-09-19 DIAGNOSIS — Z9884 Bariatric surgery status: Secondary | ICD-10-CM | POA: Diagnosis not present

## 2019-09-19 DIAGNOSIS — R209 Unspecified disturbances of skin sensation: Secondary | ICD-10-CM | POA: Diagnosis not present

## 2019-09-19 DIAGNOSIS — E559 Vitamin D deficiency, unspecified: Secondary | ICD-10-CM | POA: Diagnosis not present

## 2019-12-03 DIAGNOSIS — Z853 Personal history of malignant neoplasm of breast: Secondary | ICD-10-CM | POA: Diagnosis not present

## 2019-12-03 DIAGNOSIS — Z9889 Other specified postprocedural states: Secondary | ICD-10-CM | POA: Diagnosis not present

## 2019-12-03 DIAGNOSIS — Z9013 Acquired absence of bilateral breasts and nipples: Secondary | ICD-10-CM | POA: Diagnosis not present

## 2020-01-22 DIAGNOSIS — R1012 Left upper quadrant pain: Secondary | ICD-10-CM | POA: Diagnosis not present

## 2020-01-23 DIAGNOSIS — E278 Other specified disorders of adrenal gland: Secondary | ICD-10-CM | POA: Diagnosis not present

## 2020-01-23 DIAGNOSIS — K6389 Other specified diseases of intestine: Secondary | ICD-10-CM | POA: Diagnosis not present

## 2020-01-23 DIAGNOSIS — R1012 Left upper quadrant pain: Secondary | ICD-10-CM | POA: Diagnosis not present

## 2020-01-23 DIAGNOSIS — M4327 Fusion of spine, lumbosacral region: Secondary | ICD-10-CM | POA: Diagnosis not present

## 2020-02-06 ENCOUNTER — Encounter (HOSPITAL_BASED_OUTPATIENT_CLINIC_OR_DEPARTMENT_OTHER): Payer: Self-pay | Admitting: Plastic Surgery

## 2020-02-06 ENCOUNTER — Other Ambulatory Visit: Payer: Self-pay

## 2020-02-06 NOTE — Progress Notes (Signed)
Per Dr. Hyacinth Meeker, no cardiac clearance needed d/t pt cleared by Dr. Bjorn Pippin, cardiologist, for surgery in July and pt has no cardiac symptoms since then.

## 2020-02-07 DIAGNOSIS — Z853 Personal history of malignant neoplasm of breast: Secondary | ICD-10-CM | POA: Diagnosis not present

## 2020-02-07 DIAGNOSIS — Z9013 Acquired absence of bilateral breasts and nipples: Secondary | ICD-10-CM | POA: Diagnosis not present

## 2020-02-07 NOTE — H&P (Signed)
  Subjective:     Patient ID: Rachel Castaneda is a 74 y.o. female.  Follow-up   Nearly 6 months post op abdominal panniculecomy. Plan excision redundant soft tissue chest and additional soft tissue mons pubis next month.  Underwent bilateral mastectomies 2017 with Dr. 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 Stopped anastrozole 2019 due to toxicities.   Genetics negative  Highest weight 330 lb Underwent gastric bypass 2000, lowest weight current. Had UHR prior to gastric bypass and notes developed abdominal hernia post surgery requiring open surgery with mesh placement.  2134 g resection abdomen panniculus  Prior to mastectomies 42/44 D, current using knitted prostheses. Notes soft tissue redundancy lateral chest.  OSA, HTN, DM resolved with weight loss. She was taking iron supplements after bypass and was found to have elevated ferritin and ultimately diagnosed with hemochromatosis. She has required phlebotomy in past, last over a year. Had event monitor in place for work up palpitations, this was without significant abnormalities. cMRI 5.17.21 normal. 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 Improved on Claritin. Reported concern with breast implants and if she will develop reaction to them at initial consult for breast reconstruction options.  Review of Systems     Objective:   Physical Exam  Cardiovascular: Normal rate, regular rhythm and normal heart sounds.  Pulmonary/Chest: Effort normal and breath sounds normal.   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 Reviewed in mirror area that can reasonably be address and likely similar scars over transverse chest and anterior axillary line with plan to stop resection caudal to axilla. This includes fullness at medial extent bilateral chest scars and discussed may have final scar that crosses midline chest. Notes when she bends forward there is redundancy along entirety chest. Reviewed she will have residual laxity/redundancy skin post procedure. Reviewed likely drains for chest, no drains over abdomen. Plan observation stay.  Additional risks including seroma hematoma bleeding damage to adjacent structures blood clots in legs or lungs infection wound healing problems need for additional procedures reviewed.  Discussed risk COVID infectionthrough this elective surgery. Patient will receive COVID testing prior to surgery. Discussed even if patient receivesa negative test result, the tests in some cases may fail to detect the virus or patient maycontract COVID after the test.COVID 19 infectionbefore/during/aftersurgery may result in lead to a higher chance of complication and death.  Rx for oxycodone, Zofran given.  Reports she was a hard stick with last surgery, that IV placement is most anxiety producing part of surgery for her. States she was told there were "two guys" that could do the difficult IV placements at last surgery; requests to have these individuals or those with more expertise on IV placement to try first.

## 2020-02-12 ENCOUNTER — Other Ambulatory Visit (HOSPITAL_COMMUNITY)
Admission: RE | Admit: 2020-02-12 | Discharge: 2020-02-12 | Disposition: A | Discharge status: Home / Self Care | Payer: Medicare Other | Source: Ambulatory Visit | Attending: Plastic Surgery | Admitting: Plastic Surgery

## 2020-02-12 DIAGNOSIS — Z01812 Encounter for preprocedural laboratory examination: Secondary | ICD-10-CM | POA: Insufficient documentation

## 2020-02-12 DIAGNOSIS — Z20822 Contact with and (suspected) exposure to covid-19: Secondary | ICD-10-CM | POA: Insufficient documentation

## 2020-02-12 LAB — SARS CORONAVIRUS 2 (TAT 6-24 HRS): SARS Coronavirus 2: NEGATIVE

## 2020-02-15 ENCOUNTER — Encounter (HOSPITAL_BASED_OUTPATIENT_CLINIC_OR_DEPARTMENT_OTHER): Payer: Self-pay | Admitting: Plastic Surgery

## 2020-02-15 ENCOUNTER — Encounter (HOSPITAL_BASED_OUTPATIENT_CLINIC_OR_DEPARTMENT_OTHER)
Admission: RE | Disposition: A | Discharge status: Home / Self Care | Payer: Self-pay | Source: Ambulatory Visit | Attending: Plastic Surgery

## 2020-02-15 ENCOUNTER — Ambulatory Visit (HOSPITAL_BASED_OUTPATIENT_CLINIC_OR_DEPARTMENT_OTHER)
Admission: RE | Admit: 2020-02-15 | Discharge: 2020-02-15 | Disposition: A | Discharge status: Home / Self Care | Payer: Medicare Other | Source: Ambulatory Visit | Attending: Plastic Surgery | Admitting: Plastic Surgery

## 2020-02-15 ENCOUNTER — Other Ambulatory Visit: Payer: Self-pay

## 2020-02-15 ENCOUNTER — Ambulatory Visit (HOSPITAL_BASED_OUTPATIENT_CLINIC_OR_DEPARTMENT_OTHER): Payer: Medicare Other | Admitting: Certified Registered"

## 2020-02-15 DIAGNOSIS — Z886 Allergy status to analgesic agent status: Secondary | ICD-10-CM | POA: Diagnosis not present

## 2020-02-15 DIAGNOSIS — Z885 Allergy status to narcotic agent status: Secondary | ICD-10-CM | POA: Insufficient documentation

## 2020-02-15 DIAGNOSIS — L987 Excessive and redundant skin and subcutaneous tissue: Secondary | ICD-10-CM | POA: Insufficient documentation

## 2020-02-15 DIAGNOSIS — Z9884 Bariatric surgery status: Secondary | ICD-10-CM | POA: Insufficient documentation

## 2020-02-15 DIAGNOSIS — Z853 Personal history of malignant neoplasm of breast: Secondary | ICD-10-CM | POA: Diagnosis not present

## 2020-02-15 DIAGNOSIS — Z9013 Acquired absence of bilateral breasts and nipples: Secondary | ICD-10-CM | POA: Diagnosis not present

## 2020-02-15 DIAGNOSIS — Z888 Allergy status to other drugs, medicaments and biological substances status: Secondary | ICD-10-CM | POA: Insufficient documentation

## 2020-02-15 DIAGNOSIS — G4730 Sleep apnea, unspecified: Secondary | ICD-10-CM | POA: Diagnosis not present

## 2020-02-15 DIAGNOSIS — I10 Essential (primary) hypertension: Secondary | ICD-10-CM | POA: Diagnosis not present

## 2020-02-15 HISTORY — DX: Hemochromatosis, unspecified: E83.119

## 2020-02-15 HISTORY — PX: LESION EXCISION WITH COMPLEX REPAIR: SHX6700

## 2020-02-15 SURGERY — LESION EXCISION WITH COMPLEX REPAIR
Anesthesia: General | Site: Abdomen

## 2020-02-15 MED ORDER — PROPOFOL 10 MG/ML IV BOLUS
INTRAVENOUS | Status: AC
  Filled 2020-02-15: qty 40

## 2020-02-15 MED ORDER — EPHEDRINE SULFATE 50 MG/ML IJ SOLN
INTRAMUSCULAR | Status: DC | PRN
  Administered 2020-02-15: 15 mg via INTRAVENOUS

## 2020-02-15 MED ORDER — ACETAMINOPHEN 500 MG PO TABS
1000.0000 mg | ORAL_TABLET | ORAL | Status: AC
  Administered 2020-02-15: 1000 mg via ORAL

## 2020-02-15 MED ORDER — FENTANYL CITRATE (PF) 100 MCG/2ML IJ SOLN
INTRAMUSCULAR | Status: AC
  Filled 2020-02-15: qty 2

## 2020-02-15 MED ORDER — HYDROMORPHONE HCL 1 MG/ML IJ SOLN: 0.5000 mg | INTRAMUSCULAR | Status: DC | PRN

## 2020-02-15 MED ORDER — DEXAMETHASONE SODIUM PHOSPHATE 10 MG/ML IJ SOLN
INTRAMUSCULAR | Status: AC
  Filled 2020-02-15: qty 1

## 2020-02-15 MED ORDER — ACETAMINOPHEN 325 MG PO TABS: 650.0000 mg | ORAL_TABLET | Freq: Four times a day (QID) | ORAL | Status: DC | PRN

## 2020-02-15 MED ORDER — ONDANSETRON HCL 4 MG/2ML IJ SOLN: 4.0000 mg | Freq: Four times a day (QID) | INTRAMUSCULAR | Status: DC | PRN

## 2020-02-15 MED ORDER — DEXAMETHASONE SODIUM PHOSPHATE 10 MG/ML IJ SOLN
INTRAMUSCULAR | Status: DC | PRN
  Administered 2020-02-15: 5 mg via INTRAVENOUS

## 2020-02-15 MED ORDER — ACETAMINOPHEN 325 MG RE SUPP: 650.0000 mg | Freq: Four times a day (QID) | RECTAL | Status: DC | PRN

## 2020-02-15 MED ORDER — LIDOCAINE-EPINEPHRINE 1 %-1:100000 IJ SOLN
INTRAMUSCULAR | Status: AC
  Filled 2020-02-15: qty 1

## 2020-02-15 MED ORDER — MIDAZOLAM HCL 2 MG/2ML IJ SOLN
INTRAMUSCULAR | Status: AC
  Filled 2020-02-15: qty 2

## 2020-02-15 MED ORDER — DIPHENHYDRAMINE HCL 50 MG/ML IJ SOLN: 12.5000 mg | Freq: Four times a day (QID) | INTRAMUSCULAR | Status: DC | PRN

## 2020-02-15 MED ORDER — CHLORHEXIDINE GLUCONATE CLOTH 2 % EX PADS: 6.0000 | MEDICATED_PAD | Freq: Once | CUTANEOUS | Status: DC

## 2020-02-15 MED ORDER — DIPHENHYDRAMINE HCL 12.5 MG/5ML PO ELIX: 12.5000 mg | ORAL_SOLUTION | Freq: Four times a day (QID) | ORAL | Status: DC | PRN

## 2020-02-15 MED ORDER — ONDANSETRON HCL 4 MG/2ML IJ SOLN
INTRAMUSCULAR | Status: AC
  Filled 2020-02-15: qty 2

## 2020-02-15 MED ORDER — OXYCODONE HCL 5 MG/5ML PO SOLN: 5.0000 mg | Freq: Once | ORAL | Status: DC | PRN

## 2020-02-15 MED ORDER — PROPOFOL 500 MG/50ML IV EMUL
INTRAVENOUS | Status: DC | PRN
  Administered 2020-02-15: 30 ug/kg/min via INTRAVENOUS

## 2020-02-15 MED ORDER — BUPIVACAINE HCL (PF) 0.25 % IJ SOLN
INTRAMUSCULAR | Status: AC
  Filled 2020-02-15: qty 30

## 2020-02-15 MED ORDER — ACETAMINOPHEN 10 MG/ML IV SOLN: 1000.0000 mg | Freq: Once | INTRAVENOUS | Status: DC | PRN

## 2020-02-15 MED ORDER — ACETAMINOPHEN 325 MG PO TABS: 325.0000 mg | ORAL_TABLET | Freq: Once | ORAL | Status: DC | PRN

## 2020-02-15 MED ORDER — CEFAZOLIN SODIUM-DEXTROSE 2-4 GM/100ML-% IV SOLN
INTRAVENOUS | Status: AC
  Filled 2020-02-15: qty 100

## 2020-02-15 MED ORDER — OXYCODONE HCL 5 MG PO TABS
5.0000 mg | ORAL_TABLET | ORAL | Status: DC | PRN
  Administered 2020-02-15: 5 mg via ORAL
  Filled 2020-02-15: qty 1

## 2020-02-15 MED ORDER — FENTANYL CITRATE (PF) 100 MCG/2ML IJ SOLN
25.0000 ug | INTRAMUSCULAR | Status: DC | PRN
  Administered 2020-02-15 (×3): 50 ug via INTRAVENOUS

## 2020-02-15 MED ORDER — LIDOCAINE HCL (CARDIAC) PF 100 MG/5ML IV SOSY
PREFILLED_SYRINGE | INTRAVENOUS | Status: DC | PRN
  Administered 2020-02-15: 60 mg via INTRAVENOUS

## 2020-02-15 MED ORDER — ACETAMINOPHEN 160 MG/5ML PO SOLN: 325.0000 mg | Freq: Once | ORAL | Status: DC | PRN

## 2020-02-15 MED ORDER — LACTATED RINGERS IV SOLN: INTRAVENOUS | Status: DC

## 2020-02-15 MED ORDER — ONDANSETRON HCL 4 MG/2ML IJ SOLN
INTRAMUSCULAR | Status: DC | PRN
  Administered 2020-02-15: 4 mg via INTRAVENOUS

## 2020-02-15 MED ORDER — ONDANSETRON 4 MG PO TBDP: 4.0000 mg | ORAL_TABLET | Freq: Four times a day (QID) | ORAL | Status: DC | PRN

## 2020-02-15 MED ORDER — PROPOFOL 10 MG/ML IV BOLUS
INTRAVENOUS | Status: DC | PRN
  Administered 2020-02-15: 150 mg via INTRAVENOUS
  Administered 2020-02-15: 50 mg via INTRAVENOUS

## 2020-02-15 MED ORDER — BACITRACIN ZINC 500 UNIT/GM EX OINT
TOPICAL_OINTMENT | CUTANEOUS | Status: AC
  Filled 2020-02-15: qty 1.8

## 2020-02-15 MED ORDER — KCL IN DEXTROSE-NACL 20-5-0.45 MEQ/L-%-% IV SOLN
INTRAVENOUS | Status: DC
  Filled 2020-02-15: qty 1000

## 2020-02-15 MED ORDER — ACETAMINOPHEN 500 MG PO TABS
ORAL_TABLET | ORAL | Status: AC
  Filled 2020-02-15: qty 2

## 2020-02-15 MED ORDER — BUPIVACAINE HCL (PF) 0.5 % IJ SOLN
INTRAMUSCULAR | Status: DC | PRN
  Administered 2020-02-15: 30 mL

## 2020-02-15 MED ORDER — OXYCODONE HCL 5 MG PO TABS: 5.0000 mg | ORAL_TABLET | Freq: Once | ORAL | Status: DC | PRN

## 2020-02-15 MED ORDER — FENTANYL CITRATE (PF) 100 MCG/2ML IJ SOLN
INTRAMUSCULAR | Status: DC | PRN
  Administered 2020-02-15 (×8): 25 ug via INTRAVENOUS

## 2020-02-15 MED ORDER — AMISULPRIDE (ANTIEMETIC) 5 MG/2ML IV SOLN: 10.0000 mg | Freq: Once | INTRAVENOUS | Status: DC | PRN

## 2020-02-15 MED ORDER — LIDOCAINE 2% (20 MG/ML) 5 ML SYRINGE
INTRAMUSCULAR | Status: AC
  Filled 2020-02-15: qty 5

## 2020-02-15 MED ORDER — MIDAZOLAM HCL 2 MG/2ML IJ SOLN
INTRAMUSCULAR | Status: DC | PRN
  Administered 2020-02-15: 2 mg via INTRAVENOUS

## 2020-02-15 MED ORDER — ROCURONIUM BROMIDE 10 MG/ML (PF) SYRINGE
PREFILLED_SYRINGE | INTRAVENOUS | Status: AC
  Filled 2020-02-15: qty 10

## 2020-02-15 MED ORDER — CEFAZOLIN SODIUM-DEXTROSE 2-4 GM/100ML-% IV SOLN
2.0000 g | INTRAVENOUS | Status: AC
  Administered 2020-02-15: 2 g via INTRAVENOUS

## 2020-02-15 SURGICAL SUPPLY — 63 items
ADH SKN CLS APL DERMABOND .7 (GAUZE/BANDAGES/DRESSINGS) ×5
BALL CTTN LRG ABS STRL LF (GAUZE/BANDAGES/DRESSINGS)
BINDER BREAST XLRG (GAUZE/BANDAGES/DRESSINGS) ×2 IMPLANT
BLADE 10 SAFETY STRL DISP (BLADE) ×6 IMPLANT
BLADE CLIPPER SURG (BLADE) ×2 IMPLANT
BLADE SURG 15 STRL LF DISP TIS (BLADE) ×1 IMPLANT
BLADE SURG 15 STRL SS (BLADE) ×2
COTTONBALL LRG STERILE PKG (GAUZE/BANDAGES/DRESSINGS) IMPLANT
COVER BACK TABLE 60X90IN (DRAPES) IMPLANT
COVER MAYO STAND STRL (DRAPES) ×2 IMPLANT
COVER WAND RF STERILE (DRAPES) IMPLANT
DECANTER SPIKE VIAL GLASS SM (MISCELLANEOUS) IMPLANT
DERMABOND ADVANCED (GAUZE/BANDAGES/DRESSINGS) ×5
DERMABOND ADVANCED .7 DNX12 (GAUZE/BANDAGES/DRESSINGS) ×5 IMPLANT
DRAIN CHANNEL 15F RND FF W/TCR (WOUND CARE) ×4 IMPLANT
DRAPE TOP ARMCOVERS (MISCELLANEOUS) ×2 IMPLANT
DRAPE U-SHAPE 76X120 STRL (DRAPES) ×2 IMPLANT
DRAPE UTILITY XL STRL (DRAPES) ×4 IMPLANT
ELECT COATED BLADE 2.86 ST (ELECTRODE) ×2 IMPLANT
ELECT NEEDLE BLADE 2-5/6 (NEEDLE) IMPLANT
ELECT REM PT RETURN 9FT ADLT (ELECTROSURGICAL) ×2
ELECT REM PT RETURN 9FT PED (ELECTROSURGICAL)
ELECTRODE REM PT RETRN 9FT PED (ELECTROSURGICAL) IMPLANT
ELECTRODE REM PT RTRN 9FT ADLT (ELECTROSURGICAL) ×1 IMPLANT
EVACUATOR SILICONE 100CC (DRAIN) ×4 IMPLANT
GAUZE XEROFORM 1X8 LF (GAUZE/BANDAGES/DRESSINGS) IMPLANT
GAUZE XEROFORM 5X9 LF (GAUZE/BANDAGES/DRESSINGS) IMPLANT
GLOVE BIOGEL PI IND STRL 7.0 (GLOVE) ×2 IMPLANT
GLOVE BIOGEL PI INDICATOR 7.0 (GLOVE) ×2
GLOVE ECLIPSE 6.5 STRL STRAW (GLOVE) ×4 IMPLANT
GLOVE SURG ENC MOIS LTX SZ6 (GLOVE) ×2 IMPLANT
GLOVE SURG UNDER POLY LF SZ6.5 (GLOVE) ×4 IMPLANT
GOWN STRL REUS W/ TWL LRG LVL3 (GOWN DISPOSABLE) ×2 IMPLANT
GOWN STRL REUS W/TWL LRG LVL3 (GOWN DISPOSABLE) ×4
MARKER PEN SURG W/LABELS BLK (STERILIZATION PRODUCTS) ×2 IMPLANT
NEEDLE HYPO 30GX1 BEV (NEEDLE) IMPLANT
NEEDLE PRECISIONGLIDE 27X1.5 (NEEDLE) IMPLANT
PACK BASIN DAY SURGERY FS (CUSTOM PROCEDURE TRAY) ×2 IMPLANT
PENCIL SMOKE EVAC W/HOLSTER (ELECTROSURGICAL) ×2 IMPLANT
PENCIL SMOKE EVACUATOR (MISCELLANEOUS) ×2 IMPLANT
SHEET MEDIUM DRAPE 40X70 STRL (DRAPES) ×4 IMPLANT
SLEEVE SCD COMPRESS KNEE MED (MISCELLANEOUS) ×2 IMPLANT
SPONGE GAUZE 2X2 8PLY STRL LF (GAUZE/BANDAGES/DRESSINGS) IMPLANT
SPONGE LAP 18X18 RF (DISPOSABLE) ×4 IMPLANT
STAPLER VISISTAT 35W (STAPLE) ×2 IMPLANT
STRIP CLOSURE SKIN 1/2X4 (GAUZE/BANDAGES/DRESSINGS) IMPLANT
STRIP CLOSURE SKIN 1/4X4 (GAUZE/BANDAGES/DRESSINGS) IMPLANT
SUT ETHILON 2 0 FS 18 (SUTURE) ×4 IMPLANT
SUT MNCRL AB 3-0 PS2 18 (SUTURE) IMPLANT
SUT MNCRL AB 4-0 PS2 18 (SUTURE) ×8 IMPLANT
SUT PDS AB 2-0 CT2 27 (SUTURE) ×10 IMPLANT
SUT PLAIN 5 0 P 3 18 (SUTURE) IMPLANT
SUT PROLENE 5 0 P 3 (SUTURE) IMPLANT
SUT PROLENE 5 0 PS 2 (SUTURE) IMPLANT
SUT PROLENE 6 0 P 1 18 (SUTURE) IMPLANT
SUT VIC AB 3-0 SH 27 (SUTURE)
SUT VIC AB 3-0 SH 27X BRD (SUTURE) IMPLANT
SUT VLOC 180 0 24IN GS25 (SUTURE) ×6 IMPLANT
SWABSTICK POVIDONE IODINE SNGL (MISCELLANEOUS) IMPLANT
SYR BULB EAR ULCER 3OZ GRN STR (SYRINGE) ×2 IMPLANT
SYR CONTROL 10ML LL (SYRINGE) ×2 IMPLANT
TOWEL GREEN STERILE FF (TOWEL DISPOSABLE) ×2 IMPLANT
TRAY DSU PREP LF (CUSTOM PROCEDURE TRAY) IMPLANT

## 2020-02-15 NOTE — Op Note (Signed)
Operative Note   DATE OF OPERATION: 1.7.22  LOCATION: [facility]-observation  SURGICAL DIVISION: Plastic Surgery  PREOPERATIVE DIAGNOSES:  1. History breast cancer 2. Acquired absence breasts 3. History bariatric surgery 4. S/p panniculectomy  POSTOPERATIVE DIAGNOSES:  same  PROCEDURE:  Complex repair trunk 75 cm  SURGEON: Irene Limbo MD MBA  ASSISTANT: none  ANESTHESIA:  General.   EBL: 50 ml  COMPLICATIONS: None immediate.   INDICATIONS FOR PROCEDURE:  The patient, Rachel Castaneda, is a 75 y.o. female born on [birth year], is here for treatment redudant skin chest wall and mons following bilateral mastectomies and bariatric surgery. Patient has had prior abdominal panniculectomy.   FINDINGS: Right chest resection 239 g Left chest resection 240 g  DESCRIPTION OF PROCEDURE:  The patient's operative site was marked with the patient in the preoperative area. In standing position, areas of resection marked over mons and bilateral chest wall. The patient was taken to the operating room. SCDs were placed and IV antibiotics were given. The patient's operative site was prepped and draped in a sterile fashion. A time out was performed and all information was confirmed to be correct. Incision made in abdomen and mons and soft tissue resection completed. Hemostasis ensured. Local anesthetic infiltrated. Closure completed with interrupted 2-0 PDS in superficial fascia, 0 V lock in dermis and 4-0 monocryl subcuticular skin, length closure 15 cm.   I then directed attention to right chest wall. Skin and soft tissue resected sharply. 15 Fr JP placed over lateral chest wall and secured to skin with 2-0 nylon. Closure completed with interrupted 2-0 PDS in superficial fascia, 0 V lock in dermis and 4-0 monocryl subcuticular skin, length closure 30 cm. Similar resection and closure of soft tissue completed over left chest. 15 Fr JP placed over lateral chest wall and secured to skin with 2-0  nylon. Length repair left chest 30 cm. Dermabond and dry dressing applied to all incisions. Breast binder applied.   The patient was allowed to wake from anesthesia, extubated and taken to the recovery room in satisfactory condition.   SPECIMENS: none  DRAINS: 15 Fr JP in right and left subcutaneous chest

## 2020-02-15 NOTE — Interval H&P Note (Signed)
History and Physical Interval Note:  02/15/2020 7:05 AM  Rachel Castaneda  has presented today for surgery, with the diagnosis of acquired absence breasts, history breast cancer, s/p panniculectomy.  The various methods of treatment have been discussed with the patient and family. After consideration of risks, benefits and other options for treatment, the patient has consented to  Procedure(s): COMPLEX REPAIR TRUNK >20CM (N/A) as a surgical intervention.  The patient's history has been reviewed, patient examined, no change in status, stable for surgery.  I have reviewed the patient's chart and labs.  Questions were answered to the patient's satisfaction.     Arnoldo Hooker Kenadi Miltner

## 2020-02-15 NOTE — Transfer of Care (Signed)
Immediate Anesthesia Transfer of Care Note  Patient: Rachel Castaneda  Procedure(s) Performed: Excision soft tissue chest and abdomen (N/A Abdomen)  Patient Location: PACU  Anesthesia Type:General  Level of Consciousness: awake, alert  and oriented  Airway & Oxygen Therapy: Patient Spontanous Breathing and Patient connected to face mask oxygen  Post-op Assessment: Report given to RN and Post -op Vital signs reviewed and stable  Post vital signs: Reviewed and stable  Last Vitals:  Vitals Value Taken Time  BP    Temp    Pulse    Resp    SpO2      Last Pain:  Vitals:   02/15/20 0646  TempSrc: Oral  PainSc: 0-No pain         Complications: No complications documented.

## 2020-02-15 NOTE — Discharge Instructions (Signed)
Post Anesthesia Home Care Instructions  Activity: Get plenty of rest for the remainder of the day. A responsible individual must stay with you for 24 hours following the procedure.  For the next 24 hours, DO NOT: -Drive a car -Paediatric nurse -Drink alcoholic beverages -Take any medication unless instructed by your physician -Make any legal decisions or sign important papers.  Meals: Start with liquid foods such as gelatin or soup. Progress to regular foods as tolerated. Avoid greasy, spicy, heavy foods. If nausea and/or vomiting occur, drink only clear liquids until the nausea and/or vomiting subsides. Call your physician if vomiting continues.  Special Instructions/Symptoms: Your throat may feel dry or sore from the anesthesia or the breathing tube placed in your throat during surgery. If this causes discomfort, gargle with warm salt water. The discomfort should disappear within 24 hours.  If you had a scopolamine patch placed behind your ear for the management of post- operative nausea and/or vomiting:  1. The medication in the patch is effective for 72 hours, after which it should be removed.  Wrap patch in a tissue and discard in the trash. Wash hands thoroughly with soap and water. 2. You may remove the patch earlier than 72 hours if you experience unpleasant side effects which may include dry mouth, dizziness or visual disturbances. 3. Avoid touching the patch. Wash your hands with soap and water after contact with the patch.    About my Jackson-Pratt Bulb Drain  What is a Jackson-Pratt bulb? A Jackson-Pratt is a soft, round device used to collect drainage. It is connected to a long, thin drainage catheter, which is held in place by one or two small stiches near your surgical incision site. When the bulb is squeezed, it forms a vacuum, forcing the drainage to empty into the bulb.  Emptying the Jackson-Pratt bulb- To empty the bulb: 1. Release the plug on the top of the  bulb. 2. Pour the bulb's contents into a measuring container which your nurse will provide. 3. Record the time emptied and amount of drainage. Empty the drain(s) as often as your     doctor or nurse recommends.  Date                  Time                    Amount (Drain 1)                 Amount (Drain 2)  _____________________________________________________________________  _____________________________________________________________________  _____________________________________________________________________  _____________________________________________________________________  _____________________________________________________________________  _____________________________________________________________________  _____________________________________________________________________  _____________________________________________________________________  Squeezing the Jackson-Pratt Bulb- To squeeze the bulb: 1. Make sure the plug at the top of the bulb is open. 2. Squeeze the bulb tightly in your fist. You will hear air squeezing from the bulb. 3. Replace the plug while the bulb is squeezed. 4. Use a safety pin to attach the bulb to your clothing. This will keep the catheter from     pulling at the bulb insertion site.  When to call your doctor- Call your doctor if:  Drain site becomes red, swollen or hot.  You have a fever greater than 101 degrees F.  There is oozing at the drain site.  Drain falls out (apply a guaze bandage over the drain hole and secure it with tape).  Drainage increases daily not related to activity patterns. (You will usually have more drainage when you are active than when you are resting.)  Drainage has a  bad odor.  5mg  Oxycodone taken at 3:30pm

## 2020-02-15 NOTE — Anesthesia Preprocedure Evaluation (Addendum)
Anesthesia Evaluation  Patient identified by MRN, date of birth, ID band Patient awake    Reviewed: Allergy & Precautions, NPO status , Patient's Chart, lab work & pertinent test results  Airway Mallampati: I  TM Distance: >3 FB Neck ROM: Full    Dental  (+) Teeth Intact, Dental Advisory Given   Pulmonary sleep apnea and Continuous Positive Airway Pressure Ventilation ,    breath sounds clear to auscultation       Cardiovascular hypertension, + dysrhythmias  Rhythm:Regular Rate:Normal     Neuro/Psych  Headaches, Anxiety    GI/Hepatic negative GI ROS, Neg liver ROS,   Endo/Other  diabetes  Renal/GU negative Renal ROS     Musculoskeletal  (+) Arthritis ,   Abdominal Normal abdominal exam  (+)   Peds  Hematology negative hematology ROS (+)   Anesthesia Other Findings Pt denies issue with reflux s/p gastric bypass  Reproductive/Obstetrics                            Anesthesia Physical Anesthesia Plan  ASA: II  Anesthesia Plan: General   Post-op Pain Management:    Induction: Intravenous  PONV Risk Score and Plan: 4 or greater and Ondansetron, Dexamethasone and Treatment may vary due to age or medical condition  Airway Management Planned: LMA  Additional Equipment: None  Intra-op Plan:   Post-operative Plan: Extubation in OR  Informed Consent: I have reviewed the patients History and Physical, chart, labs and discussed the procedure including the risks, benefits and alternatives for the proposed anesthesia with the patient or authorized representative who has indicated his/her understanding and acceptance.       Plan Discussed with: CRNA  Anesthesia Plan Comments: (Echo:  1. Left ventricular ejection fraction, by estimation, is 55 to 60%. The left ventricle has normal function. The left ventricle has no regional wall motion abnormalities. There is moderate asymmetric left  ventricular hypertrophy, 14 mm at basal septal segment. No left ventricular outflow tract obstruction. Left ventricular diastolic parameters were normal. 2. Right ventricular systolic function is normal. The right ventricular size is normal. There is normal pulmonary artery systolic pressure. The estimated right ventricular systolic pressure is 28.3 mmHg. 3. The mitral valve is normal in structure. Trivial mitral valve regurgitation. No evidence of mitral stenosis. 4. The aortic valve is tricuspid. Aortic valve regurgitation is not visualized. No aortic stenosis is present. 5. Aortic dilatation noted. There is borderline dilatation of the ascending aorta measuring 38 mm. 6. The inferior vena cava is normal in size with greater than 50% respiratory variability, suggesting right atrial pressure of 3 mmHg.)       Anesthesia Quick Evaluation

## 2020-02-15 NOTE — Anesthesia Procedure Notes (Signed)
Procedure Name: LMA Insertion Performed by: Verita Lamb, CRNA Pre-anesthesia Checklist: Patient identified, Emergency Drugs available, Suction available and Patient being monitored Patient Re-evaluated:Patient Re-evaluated prior to induction Oxygen Delivery Method: Circle system utilized Preoxygenation: Pre-oxygenation with 100% oxygen Induction Type: IV induction Ventilation: Mask ventilation without difficulty LMA: LMA inserted LMA Size: 3.0 Tube type: Oral Number of attempts: 1 Placement Confirmation: positive ETCO2,  CO2 detector and breath sounds checked- equal and bilateral Tube secured with: Tape Dental Injury: Teeth and Oropharynx as per pre-operative assessment

## 2020-02-15 NOTE — Anesthesia Postprocedure Evaluation (Signed)
Anesthesia Post Note  Patient: Rachel Castaneda  Procedure(s) Performed: Excision soft tissue chest and abdomen (N/A Abdomen)     Patient location during evaluation: PACU Anesthesia Type: General Level of consciousness: awake and alert Pain management: pain level controlled Vital Signs Assessment: post-procedure vital signs reviewed and stable Respiratory status: spontaneous breathing, nonlabored ventilation, respiratory function stable and patient connected to nasal cannula oxygen Cardiovascular status: blood pressure returned to baseline and stable Postop Assessment: no apparent nausea or vomiting Anesthetic complications: no   No complications documented.  Last Vitals:  Vitals:   02/15/20 1245 02/15/20 1305  BP: 132/76 (!) 142/81  Pulse: 79 80  Resp: (!) 24 18  Temp:  36.5 C  SpO2: 96% 100%    Last Pain:  Vitals:   02/15/20 1400  TempSrc:   PainSc: Asleep                 Effie Berkshire

## 2020-02-15 NOTE — Discharge Summary (Signed)
Physician Discharge Summary  Patient ID: Rachel Castaneda MRN: [id number] DOB/AGE: [birth year] 75 y.o.  Admit date: 02/15/2020 Discharge date: 02/15/2020  Admission Diagnoses: History breast cancer, s/p panniculectomy, s/p bariatric surgery  Discharge Diagnoses:  same  Discharged Condition: stable  Hospital Course: Post operatively patient observed in recovery care. Patient had pain controlled and tolerated diet. Patient requested discharge on POD#0 in afternoon. Instructed on drain care and bathing.  Treatments: surgery: complex repair chest 75 cm  Discharge Exam: Blood pressure (!) 142/81, pulse 80, temperature 97.7 F (36.5 C), resp. rate 18, height 5\' 3"  (1.6 m), weight 65.5 kg, SpO2 100 %. Incision/Wound: incisions intact with dressings in place, JP drains serosanguinous  Disposition: Discharge disposition: 01-Home or Self Care       Discharge Instructions    Call MD for:  redness, tenderness, or signs of infection (pain, swelling, bleeding, redness, odor or green/yellow discharge around incision site)   Complete by: As directed    Call MD for:  temperature >100.5   Complete by: As directed    Discharge instructions   Complete by: As directed    Ok to remove dressings and shower am 1.9.22. Soap and water ok, pat incisions dry. No creams or ointments over incisions. Do not let drains dangle in shower, attach to lanyard or similar.Strip and record drains twice daily and bring log to clinic visit.  Breast binder or or compression garment all other times.  Patient received all Rx prior to surgery.  Ok to raise arms above shoulders for bathing and dressing.  No house yard work or exercise until cleared by MD.   Driving Restrictions   Complete by: As directed    No driving through follow up visit and then if taking prescription pain medication   Lifting restrictions   Complete by: As directed    No lifting > 5 lbs until cleared by MD   Resume previous diet   Complete by:  As directed      Allergies as of 02/15/2020      Reactions   Codeine Hives   Baclofen Other (See Comments)   Nightmares   Dairy Aid [lactase]    Gassy, rash   Gabapentin    Weight gain/ severe depression and memory loss   Ibuprofen Itching, Swelling   Topamax [topiramate]    Depressed, fuzzy feeling   Adhesive [tape] Rash   Redness/    Nsaids Rash   Tramadol Rash      Medication List    TAKE these medications   acetaminophen 500 MG tablet Commonly known as: TYLENOL Take by mouth.   Biotin 1000 MCG tablet Take 1,000 mcg by mouth daily.   DSS 100 MG Caps Take by mouth.   fexofenadine 180 MG tablet Commonly known as: ALLEGRA   hydrocortisone cream 1 % Apply topically.   metroNIDAZOLE 0.75 % gel Commonly known as: METROGEL Apply topically.   Multi-Vitamin tablet Take by mouth.   nystatin cream Commonly known as: MYCOSTATIN Apply topically.   polyethylene glycol 17 g packet Commonly known as: MIRALAX / GLYCOLAX Take 17 g by mouth daily.   VITAMIN B 12 PO Take 2,500 mcg by mouth once a week.       Follow-up Information    Rachel Limbo, MD In 1 week.   Specialty: Plastic Surgery Why: as scheduled Contact information: [address]  [place] [address] [phone]               Signed: Arnoldo Castaneda  Rachel Castaneda 02/15/2020, 2:14 PM

## 2020-02-18 ENCOUNTER — Encounter (HOSPITAL_BASED_OUTPATIENT_CLINIC_OR_DEPARTMENT_OTHER): Payer: Self-pay | Admitting: Plastic Surgery

## 2020-03-06 DIAGNOSIS — Z9889 Other specified postprocedural states: Secondary | ICD-10-CM | POA: Diagnosis not present

## 2020-03-06 DIAGNOSIS — Z853 Personal history of malignant neoplasm of breast: Secondary | ICD-10-CM | POA: Diagnosis not present

## 2020-03-06 DIAGNOSIS — Z9013 Acquired absence of bilateral breasts and nipples: Secondary | ICD-10-CM | POA: Diagnosis not present

## 2020-03-27 DIAGNOSIS — Z9013 Acquired absence of bilateral breasts and nipples: Secondary | ICD-10-CM | POA: Diagnosis not present

## 2020-03-27 DIAGNOSIS — Z853 Personal history of malignant neoplasm of breast: Secondary | ICD-10-CM | POA: Diagnosis not present

## 2020-03-27 DIAGNOSIS — Z9889 Other specified postprocedural states: Secondary | ICD-10-CM | POA: Diagnosis not present

## 2020-03-28 DIAGNOSIS — H524 Presbyopia: Secondary | ICD-10-CM | POA: Diagnosis not present

## 2020-04-17 DIAGNOSIS — Z853 Personal history of malignant neoplasm of breast: Secondary | ICD-10-CM | POA: Diagnosis not present

## 2020-04-17 DIAGNOSIS — Z9013 Acquired absence of bilateral breasts and nipples: Secondary | ICD-10-CM | POA: Diagnosis not present

## 2020-04-17 DIAGNOSIS — S21101D Unspecified open wound of right front wall of thorax without penetration into thoracic cavity, subsequent encounter: Secondary | ICD-10-CM | POA: Diagnosis not present

## 2020-05-21 NOTE — Progress Notes (Signed)
Patient Care Team: Ernestene Kiel, MD as PCP - General (Internal Medicine)  DIAGNOSIS:    ICD-10-CM   1. 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  Tis N0 stage 0, ER/PR 100%   07/14/2015 Oncotype testing   Recurrence score: 17; ROR 11% (low-risk)   08/21/2015 Procedure   Genetic testing: Negative. Genes analyzed: APC, ATM, AXIN2, BARD1, BMPR1A, BRCA1, BRCA2, BRIP1, CDH1, CDK4, CDKN2A, CHEK2, EPCAM, FANCC, MLH1, MSH2, MSH6, MUTYH, NBN, PALB2, PMS2, POLD1, POLE, PTEN, RAD51C, RAD51D, SCG5/GREM1, SMAD4, STK11, TP53, VHL, and XRCC2.     08/26/2015 - 05/08/2017 Anti-estrogen oral therapy   Anastrazole 1 mg daily. Planned duration of therapy: 5-10 years.      CHIEF COMPLIANT: Follow-up of left breast cancer on surveillance  INTERVAL HISTORY: Rachel Castaneda is a 75 y.o. with above-mentioned history of left breast cancer who underwent bilateral mastectomies and was on antiestrogen therapy with anastrozole, but it was discontinued due to severe toxicities. She presents to the clinic today for follow-up.  She had several surgeries last year for panniculitis and breast reconstruction.  She is finally done with all of the surgeries and is doing quite well.  Her major issue is recurrent episodes of hypoglycemia for which she is going to see an endocrinologist at [facility].  ALLERGIES:  is allergic to codeine, baclofen, dairy aid [lactase], gabapentin,  ibuprofen, topamax [topiramate], adhesive [tape], nsaids, and tramadol.  MEDICATIONS:  Current Outpatient Medications  Medication Sig Dispense Refill  . acetaminophen (TYLENOL) 500 MG tablet Take by mouth.    . Biotin 1000 MCG tablet Take 1,000 mcg by mouth daily.     . Cyanocobalamin (VITAMIN B 12 PO) Take 2,500 mcg by mouth once a week.     Mariane Baumgarten Sodium (DSS) 100 MG CAPS Take by mouth.    . fexofenadine (ALLEGRA) 180 MG tablet     . hydrocortisone cream 1 % Apply topically.    . metroNIDAZOLE (METROGEL) 0.75 % gel Apply topically.    . Multiple Vitamin (MULTI-VITAMIN) tablet Take by mouth.    . nystatin cream (MYCOSTATIN) Apply topically.    . polyethylene glycol (MIRALAX / GLYCOLAX) 17 g packet Take 17 g by mouth daily.     No current facility-administered medications for this visit.    PHYSICAL EXAMINATION: ECOG PERFORMANCE STATUS: 1 - Symptomatic but completely ambulatory  Vitals:   05/22/20 0806  BP: 131/83  Pulse: 77  Resp: 20  Temp: 97.7 F (36.5 C)  SpO2: 99%   Filed Weights   05/22/20 0806  Weight: 150 lb 9.6 oz (68.3 kg)    BREAST: Bilateral chest wall scars from plastic surgery.  No palpable lumps or nodules.  (exam performed in the presence of a chaperone)  LABORATORY DATA:  I have reviewed the data as listed CMP Latest Ref Rng & Units 06/04/2019 07/10/2015 04/06/2012  Glucose 65 - 99 mg/dL 98 100(H) 99  BUN 8 - 27 mg/dL 12  11 15  Creatinine 0.57 - 1.00 mg/dL 0.63 0.60 0.54  Sodium 134 - 144 mmol/L 142 142 142  Potassium 3.5 - 5.2 mmol/L 4.5 3.5 4.4  Chloride 96 - 106 mmol/L 101 105 103  CO2 20 - 29 mmol/L 23 29 32  Calcium 8.7 - 10.3 mg/dL 9.8 9.1 9.5  Total Protein 6.5 - 8.1 g/dL - 6.3(L) -  Total Bilirubin 0.3 - 1.2 mg/dL - 0.6 -  Alkaline Phos 38 - 126 U/L - 86 -  AST 15 - 41 U/L - 21 -  ALT 14 - 54 U/L - 16 -    Lab Results  Component Value Date   WBC 11.1 (H) 07/15/2015   HGB 11.3 (L) 07/15/2015   HCT 34.8 (L) 07/15/2015   MCV 94.8  07/15/2015   PLT 191 07/15/2015   NEUTROABS 4.1 07/10/2015    ASSESSMENT & PLAN:  Bilateral breast cancer ([place]) Bilateral mastectomies 07/14/2015: Left: IDC grade to 1.7 cm, with DCIS, LVI present, 0/5 lymph nodes negative, T1 cN0 stage IA, ER 100%, PR positive, HER-2 negative, Ki-67 15%; right: 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  She now takes a low histamine diet and Allegra which has been helping her.  Rash has resolved.  Anastrozole 1 mg dailystarted 07/18/2017completed March 2019 stopped due to toxicities  Surveillance: 1.Breast exam 05/24/2019: Bilateral chest wall scars from plastic surgery to remove on the excess tissue., no palpable abnormalities 2.mammograms: No role of mammogram since she had bilateral mastectomies  Panniculitis: Status post abdominal panniculectomy Hypoglycemia episodes: Patient is going to see endocrinology. Patient is hoping to travel to [place] and [place] to follow her ancestry once the pandemic gets better  Return to clinic on an as-needed basis.    No orders of the defined types were placed in this encounter.  The patient has a good understanding of the overall plan. she agrees with it. she will call with any problems that may develop before the next visit here.  Total time spent: 20 mins including face to face time and time spent for planning, charting and coordination of care  Rulon Eisenmenger, MD, MPH 05/22/2020  I, Molly Dorshimer, am acting as scribe for Dr. Nicholas Lose.  I have reviewed the above documentation for accuracy and completeness, and I agree with the above.

## 2020-05-21 NOTE — Assessment & Plan Note (Signed)
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  She now takes a low histamine diet and Allegra which has been helping her.  Rash has resolved.  Anastrozole 1 mg dailystarted 07/18/2017completed March 2019 stopped due to toxicities  Surveillance: 1.Breast exam 05/24/2019: No palpable abnormalities 2.mammograms: No role of mammogram since she had bilateral mastectomies  Panniculitis: Status post abdominal panniculectomy  Patient is hoping to travel to [place] and [place] to follow her ancestry once the pandemic gets better  Return to clinic in 1 year for follow-up

## 2020-05-22 ENCOUNTER — Inpatient Hospital Stay: Payer: Medicare Other | Attending: Hematology and Oncology | Admitting: Hematology and Oncology

## 2020-05-22 ENCOUNTER — Other Ambulatory Visit: Payer: Self-pay

## 2020-05-22 ENCOUNTER — Ambulatory Visit: Payer: Medicare Other | Admitting: Hematology and Oncology

## 2020-05-22 DIAGNOSIS — C50812 Malignant neoplasm of overlapping sites of left female breast: Secondary | ICD-10-CM

## 2020-05-22 DIAGNOSIS — C50912 Malignant neoplasm of unspecified site of left female breast: Secondary | ICD-10-CM | POA: Insufficient documentation

## 2020-05-22 DIAGNOSIS — Z9884 Bariatric surgery status: Secondary | ICD-10-CM | POA: Insufficient documentation

## 2020-05-22 DIAGNOSIS — Z170 Estrogen receptor positive status [ER+]: Secondary | ICD-10-CM | POA: Diagnosis not present

## 2020-05-22 DIAGNOSIS — Z79811 Long term (current) use of aromatase inhibitors: Secondary | ICD-10-CM | POA: Insufficient documentation

## 2020-05-22 DIAGNOSIS — C50811 Malignant neoplasm of overlapping sites of right female breast: Secondary | ICD-10-CM | POA: Diagnosis not present

## 2020-05-22 DIAGNOSIS — E162 Hypoglycemia, unspecified: Secondary | ICD-10-CM | POA: Diagnosis not present

## 2020-05-22 DIAGNOSIS — Z79899 Other long term (current) drug therapy: Secondary | ICD-10-CM | POA: Insufficient documentation

## 2020-05-22 DIAGNOSIS — Z9013 Acquired absence of bilateral breasts and nipples: Secondary | ICD-10-CM | POA: Diagnosis not present

## 2020-05-27 ENCOUNTER — Telehealth: Payer: Self-pay | Admitting: Hematology and Oncology

## 2020-05-27 NOTE — Telephone Encounter (Signed)
Per 4/14 los, no changes made to pt schedule

## 2020-06-09 ENCOUNTER — Telehealth: Payer: Self-pay | Admitting: Cardiology

## 2020-06-09 NOTE — Telephone Encounter (Signed)
Premedicated for CMRI previously (per allergist).  Will send pre medication rx to pharmacy per protocol  . If the patient has contrast allergy: ? Patient will need a prescription for Prednisone and very clear instructions (as follows): 1. Prednisone 50 mg - take 13 hours prior to test 2. Take another Prednisone 50 mg 7 hours prior to test 3. Take another Prednisone 50 mg 1 hour prior to test 4. Take Benadryl 50 mg 1 hour prior to test . Patient must complete all four doses of above prophylactic medications. . Patient will need a ride after test due to Benadryl.

## 2020-06-09 NOTE — Telephone Encounter (Signed)
Called patient regarding the Monday 06/16/20 8:00 am MRA chest w/wo contrast at [facility]---arrival time is 7:30 am --1st floor admissions office.  Patient to come in Wednesday or Thursday of this week for labs.  Patient also asked if we were going to prescribe the "pre medications" for her again this year.  Please advise.

## 2020-06-10 ENCOUNTER — Encounter: Payer: Self-pay | Admitting: *Deleted

## 2020-06-10 ENCOUNTER — Telehealth: Payer: Self-pay | Admitting: Cardiology

## 2020-06-10 MED ORDER — PREDNISONE 50 MG PO TABS: ORAL_TABLET | ORAL | 0 refills | Status: DC

## 2020-06-10 NOTE — Telephone Encounter (Signed)
Patient made aware and instructions sent via [place].

## 2020-06-10 NOTE — Telephone Encounter (Signed)
Patent called and wanted to reschedule her MRA chest from 06/16/20 to the week on 06/23/20---appointment rescheduled to Wednesday 06/25/20 at 8:00am at [facility] time is 7:30 am 1st floor radiology for check in----patient to come in this week for lab work---will mail information to patient and she voiced her understanding.

## 2020-06-16 ENCOUNTER — Ambulatory Visit (HOSPITAL_COMMUNITY): Payer: Medicare Other

## 2020-06-16 ENCOUNTER — Ambulatory Visit: Payer: Medicare Other | Admitting: Internal Medicine

## 2020-06-16 ENCOUNTER — Other Ambulatory Visit: Payer: Self-pay

## 2020-06-16 DIAGNOSIS — Z01818 Encounter for other preprocedural examination: Secondary | ICD-10-CM | POA: Diagnosis not present

## 2020-06-16 NOTE — Progress Notes (Signed)
Placed lab order for CBC and BMET

## 2020-06-17 LAB — BASIC METABOLIC PANEL
BUN/Creatinine Ratio: 58 — ABNORMAL HIGH (ref 12–28)
BUN: 37 mg/dL — ABNORMAL HIGH (ref 8–27)
CO2: 24 mmol/L (ref 20–29)
Calcium: 8.8 mg/dL (ref 8.7–10.3)
Chloride: 104 mmol/L (ref 96–106)
Creatinine, Ser: 0.64 mg/dL (ref 0.57–1.00)
Glucose: 94 mg/dL (ref 65–99)
Potassium: 4.5 mmol/L (ref 3.5–5.2)
Sodium: 143 mmol/L (ref 134–144)
eGFR: 93 mL/min/{1.73_m2} (ref >59)

## 2020-06-17 LAB — CBC
Hematocrit: 37.5 % (ref 34.0–46.6)
Hemoglobin: 12.2 g/dL (ref 11.1–15.9)
MCH: 29.5 pg (ref 26.6–33.0)
MCHC: 32.5 g/dL (ref 31.5–35.7)
MCV: 91 fL (ref 79–97)
Platelets: 232 10*3/uL (ref 150–450)
RBC: 4.13 x10E6/uL (ref 3.77–5.28)
RDW: 12.3 % (ref 11.7–15.4)
WBC: 5.6 10*3/uL (ref 3.4–10.8)

## 2020-06-25 ENCOUNTER — Ambulatory Visit (HOSPITAL_COMMUNITY)
Admission: RE | Admit: 2020-06-25 | Discharge: 2020-06-25 | Disposition: A | Discharge status: Home / Self Care | Payer: Medicare Other | Source: Ambulatory Visit | Attending: Cardiology | Admitting: Cardiology

## 2020-06-25 ENCOUNTER — Other Ambulatory Visit: Payer: Self-pay

## 2020-06-25 DIAGNOSIS — I700 Atherosclerosis of aorta: Secondary | ICD-10-CM | POA: Diagnosis not present

## 2020-06-25 DIAGNOSIS — I771 Stricture of artery: Secondary | ICD-10-CM | POA: Diagnosis not present

## 2020-06-25 DIAGNOSIS — I712 Thoracic aortic aneurysm, without rupture, unspecified: Secondary | ICD-10-CM

## 2020-06-25 DIAGNOSIS — I708 Atherosclerosis of other arteries: Secondary | ICD-10-CM | POA: Diagnosis not present

## 2020-06-25 IMAGING — MR MR MRA CHEST W/ OR W/O CM
19 series · 19 of 19 positions shown · IV contrast (gadavist)
Comparison: Cardiac MRI [DATE]

CLINICAL DATA: 74-year-old female with a history of thoracic aortic
aneurysm

EXAM:
MRA CHEST WITH OR WITHOUT CONTRAST
TECHNIQUE: Angiographic images of the chest were obtained using MRA technique
without and with intravenous contrast.
CONTRAST:  6mL GADAVIST GADOBUTROL 1 MMOL/ML IV SOLN

[Series 2: cor_trufi_fb · coronal · 4.5mm · 0.74mm/px · 1 of 104 slices shown]
[im 1/104]
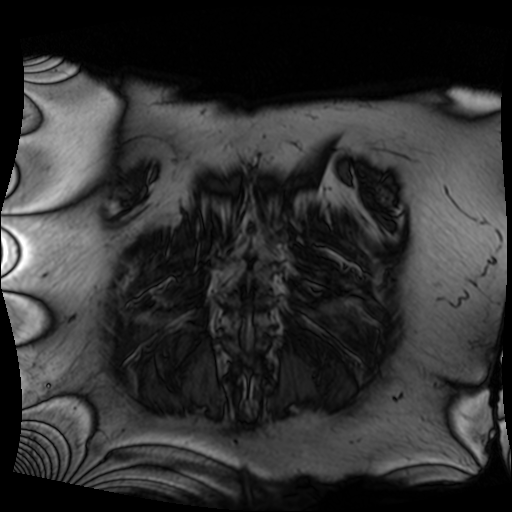

[Series 3: cor haste · coronal · 8.0mm · 1.25mm/px · 1 of 25 slices shown]
[im 1/25]
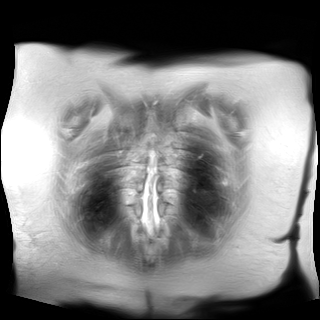

[Series 4: ax_trufi_trig · axial · 8.0mm · 1.33mm/px · 1 of 27 slices shown]
[im 1/27]
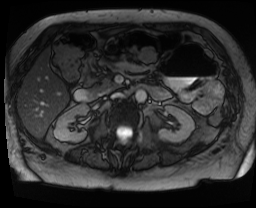

[Series 5: ax _haste_trig · axial · 8.0mm · 1.48mm/px · 1 of 30 slices shown]
[im 1/30]
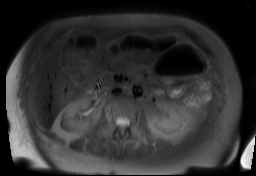

[Series 6: para-sag_cine_ aortic root_ · oblique · 6.0mm · 1.77mm/px · 1 of 75 slices shown]
[im 1/75]
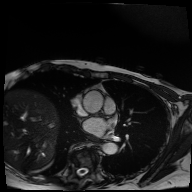

[Series 7: T1 dynamic · axial · 3.0mm · 1.25mm/px · 1 of 80 slices shown (1 of 5)]
[im 1/80]
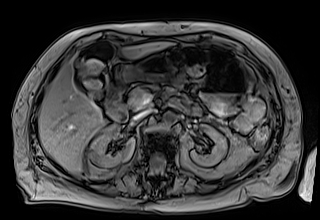

[Series 8: T1 dynamic · axial · 3.0mm · 1.25mm/px · 1 of 80 slices shown (2 of 5)]
[im 1/80]
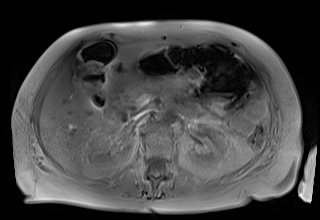

[Series 10: T1 dynamic · axial · 3.0mm · 1.25mm/px · 1 of 80 slices shown (3 of 5)]
[im 1/80]
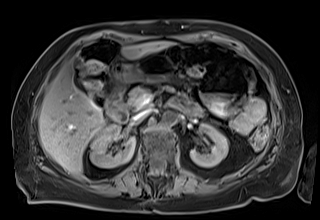

[Series 12: cor cine true-fisp · coronal · 10.0mm · 0.74mm/px · 1 of 100 slices shown (1 of 5)]
[im 1/100]
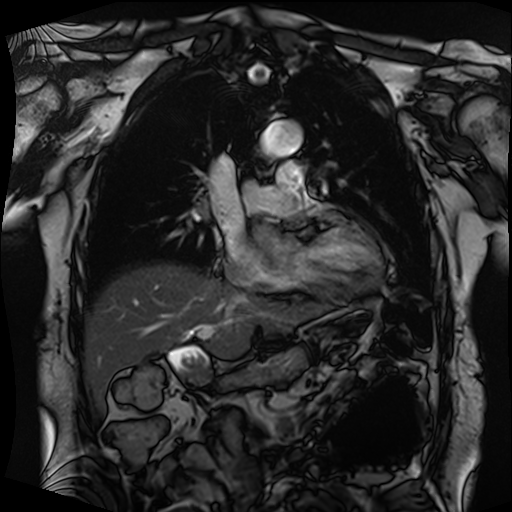

[Series 13: cor cine true-fisp · coronal · 10.0mm · 0.74mm/px · 1 of 100 slices shown (2 of 5)]
[im 1/100]
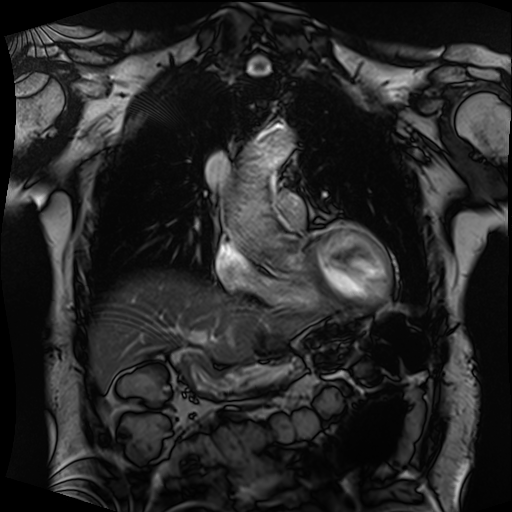

[Series 14: cor cine true-fisp · coronal · 10.0mm · 0.74mm/px · 1 of 100 slices shown (3 of 5)]
[im 1/100]
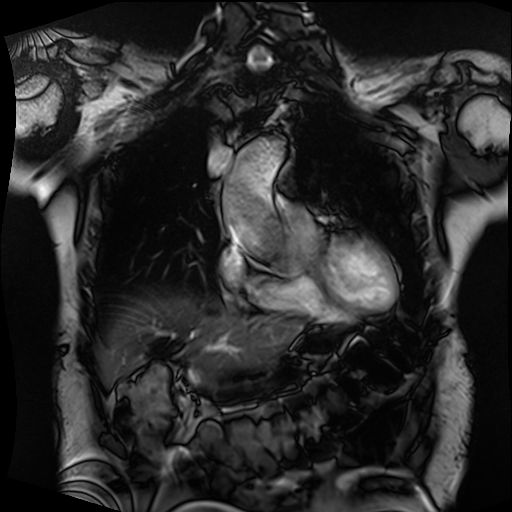

[Series 15: cor cine true-fisp · coronal · 10.0mm · 0.74mm/px · 1 of 100 slices shown (4 of 5)]
[im 1/100]
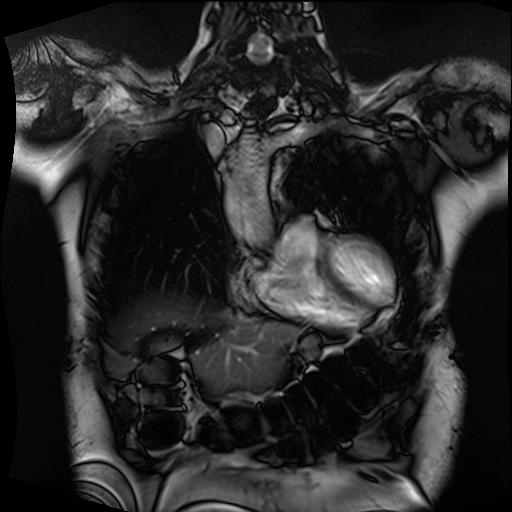

[Series 16: cor cine true-fisp · coronal · 10.0mm · 0.74mm/px · 1 of 100 slices shown (5 of 5)]
[im 1/100]
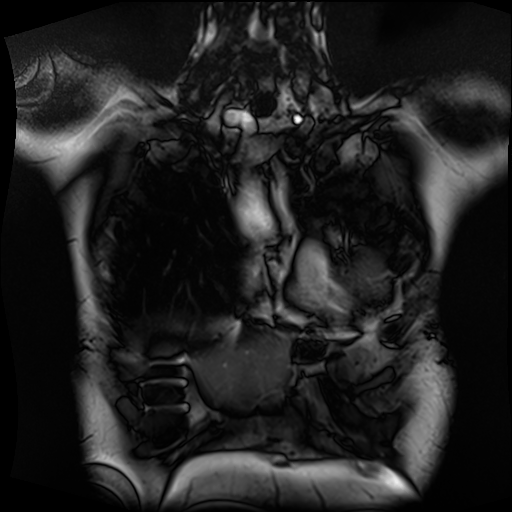

[Series 17: angio_fl3d_parasag_p3_pre · sagittal · 1.2mm · 0.99mm/px · 1 of 80 slices shown]
[im 1/80]
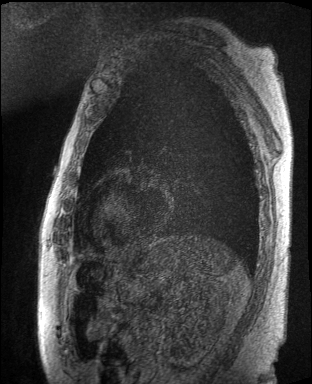

[Series 19: angio_fl3d_parasag_p3_post · sagittal · 1.2mm · 0.99mm/px · 1 of 80 slices shown]
[im 1/80]
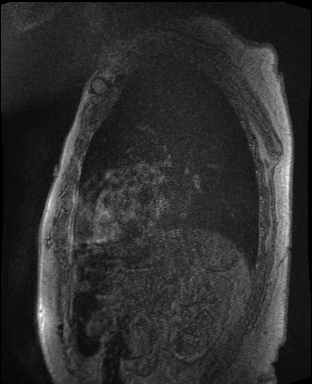

[Series 20: angio_fl3d_parasag_p3_post_sub · sagittal · 1.2mm · 0.99mm/px · 1 of 80 slices shown]
[im 1/80]
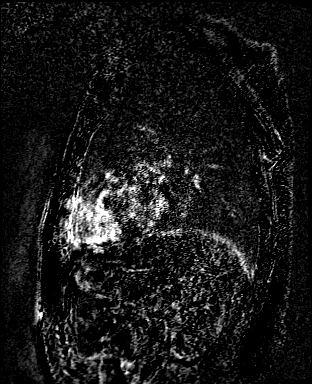

[Series 25: T1 dynamic · axial · 3.0mm · 1.25mm/px · 1 of 80 slices shown (4 of 5)]
[im 1/80]
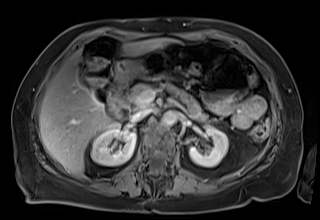

[Series 27: T1 dynamic · coronal · 4.0mm · 1.41mm/px · 1 of 56 slices shown (5 of 5)]
[im 1/56]
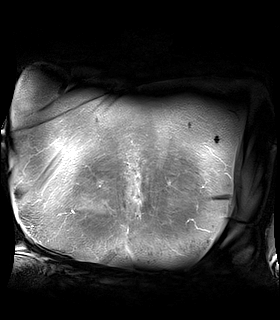

[Series 101: <mip range(1)> · axial · 0.99mm/px · 1 of 8 slices shown]
[im 1/8]
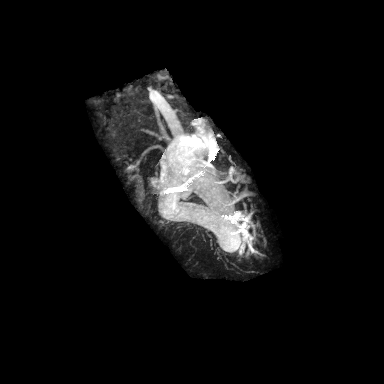

[19 of 19 positions shown; findings below may reference images not displayed]

FINDINGS: VASCULAR

Aorta: Mild fusiform aneurysmal dilation of the ascending thoracic
aorta at 4.0-4.1 cm. Conventional 3 vessel arch anatomy. No evidence
of dissection. The descending thoracic aorta is highly tortuous.
Calcified atherosclerotic plaque results in focal stenosis of the
proximal left subclavian artery.

Heart: Normal in size.  No pericardial effusion.

Pulmonary Arteries: Normal in caliber. No central pulmonary
embolism.

Other: None.

NON-VASCULAR

Lungs/pleura: No focal signal abnormality or abnormal enhancement.

Mediastinum: No evidence of thyroid mass or lymphadenopathy.

Upper abdomen: Circumscribed nonenhancing T2 hyperintense simple
cysts scattered throughout the left kidney. No acute abnormality in
the visualized upper abdomen.

Bones: No focal signal abnormality or abnormal enhancement.
IMPRESSION: VASCULAR

1. Mild aneurysmal dilation of the ascending thoracic aorta with a
maximal diameter of 4.0-4.1 cm. Recommend annual imaging followup by
CTA or MRA. This recommendation follows [TC]
ACCF/AHA/AATS/ACR/ASA/SCA/ROMANINI/ROMANINI/ROMANINI/ROMANINI Guidelines for the
Diagnosis and Management of Patients with Thoracic Aortic Disease.
Circulation. [TC]; 121: [id number]. Aortic aneurysm NOS ([TC]-[TC])
2. Moderate focal stenosis of the proximal left subclavian artery.

NON-VASCULAR

1. No acute abnormality within the abdomen or pelvis.

## 2020-06-25 MED ORDER — GADOBUTROL 1 MMOL/ML IV SOLN
6.0000 mL | Freq: Once | INTRAVENOUS | Status: AC | PRN
  Administered 2020-06-25: 6 mL via INTRAVENOUS

## 2020-08-28 ENCOUNTER — Ambulatory Visit: Payer: Medicare Other | Admitting: Internal Medicine

## 2020-09-18 DIAGNOSIS — Z0000 Encounter for general adult medical examination without abnormal findings: Secondary | ICD-10-CM | POA: Diagnosis not present

## 2020-09-18 DIAGNOSIS — Z1331 Encounter for screening for depression: Secondary | ICD-10-CM | POA: Diagnosis not present

## 2020-09-18 DIAGNOSIS — E2839 Other primary ovarian failure: Secondary | ICD-10-CM | POA: Diagnosis not present

## 2020-09-18 DIAGNOSIS — Z6826 Body mass index (BMI) 26.0-26.9, adult: Secondary | ICD-10-CM | POA: Diagnosis not present

## 2020-09-18 DIAGNOSIS — Z9884 Bariatric surgery status: Secondary | ICD-10-CM | POA: Diagnosis not present

## 2020-09-18 DIAGNOSIS — E162 Hypoglycemia, unspecified: Secondary | ICD-10-CM | POA: Diagnosis not present

## 2020-09-18 DIAGNOSIS — E559 Vitamin D deficiency, unspecified: Secondary | ICD-10-CM | POA: Diagnosis not present

## 2020-09-22 DIAGNOSIS — Z1211 Encounter for screening for malignant neoplasm of colon: Secondary | ICD-10-CM | POA: Diagnosis not present

## 2020-10-10 ENCOUNTER — Other Ambulatory Visit: Payer: Self-pay | Admitting: Internal Medicine

## 2020-10-10 DIAGNOSIS — M5136 Other intervertebral disc degeneration, lumbar region: Secondary | ICD-10-CM

## 2020-10-10 DIAGNOSIS — M549 Dorsalgia, unspecified: Secondary | ICD-10-CM

## 2020-10-23 ENCOUNTER — Other Ambulatory Visit: Payer: Self-pay

## 2020-10-23 ENCOUNTER — Ambulatory Visit
Admission: RE | Admit: 2020-10-23 | Discharge: 2020-10-23 | Disposition: A | Discharge status: Home / Self Care | Payer: Medicare Other | Source: Ambulatory Visit | Attending: Internal Medicine | Admitting: Internal Medicine

## 2020-10-23 DIAGNOSIS — M5136 Other intervertebral disc degeneration, lumbar region: Secondary | ICD-10-CM

## 2020-10-23 DIAGNOSIS — M47816 Spondylosis without myelopathy or radiculopathy, lumbar region: Secondary | ICD-10-CM | POA: Diagnosis not present

## 2020-10-23 DIAGNOSIS — M549 Dorsalgia, unspecified: Secondary | ICD-10-CM

## 2020-10-23 IMAGING — MR MR LUMBAR SPINE W/O CM
4 of 5 series · 27 of 48 positions shown · non-contrast
Comparison: [DATE]

CLINICAL DATA: Dorsalgia; degenerative disc disease, lumbar

EXAM:
MRI LUMBAR SPINE WITHOUT CONTRAST
TECHNIQUE: Multiplanar, multisequence MR imaging of the lumbar spine was
performed. No intravenous contrast was administered.

[Series 2: T2 · sagittal · 4.0mm · 1.09mm/px · 5 of 17 slices shown (1 of 2)]
[im 1/17]
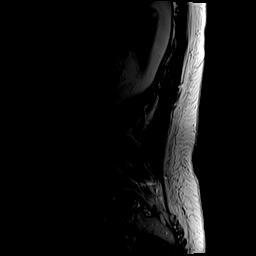
[im 5/17]
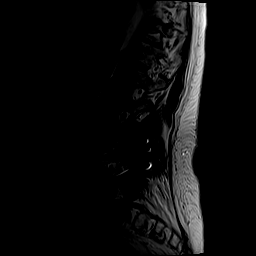
[im 9/17]
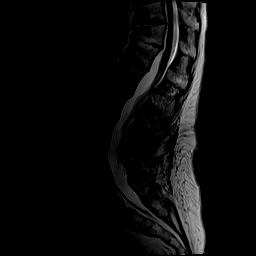
[im 13/17]
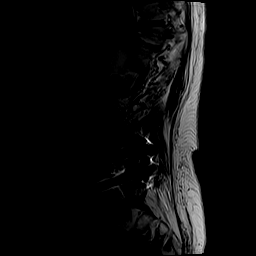
[im 17/17]
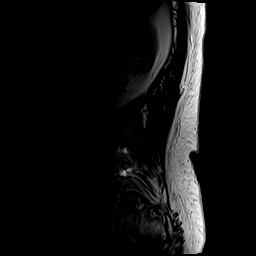

[Series 4: T1 · sagittal · 4.0mm · 1.09mm/px · 6 of 17 slices shown (1 of 2)]
[im 1/17]
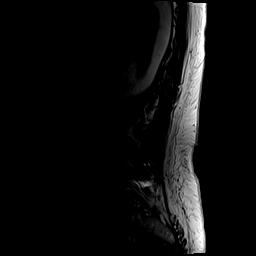
[im 4/17]
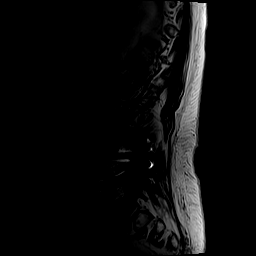
[im 7/17]
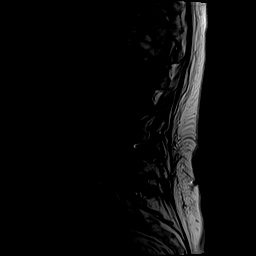
[im 10/17]
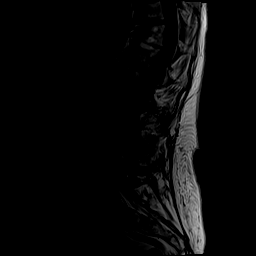
[im 13/17]
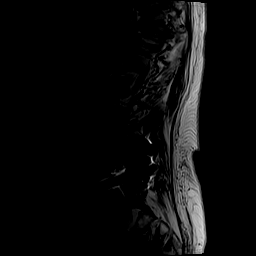
[im 17/17]
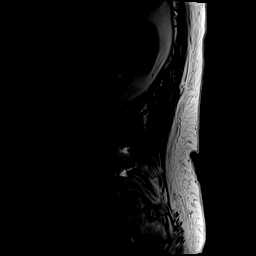

[Series 5: T2 · axial · 4.0mm · 0.39mm/px · z∈[-26,+191]mm · 10 of 47 slices shown (2 of 2)]
[im 4/47]
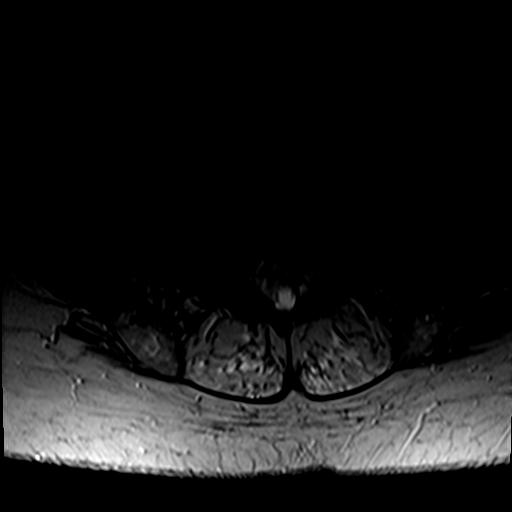
[im 7/47]
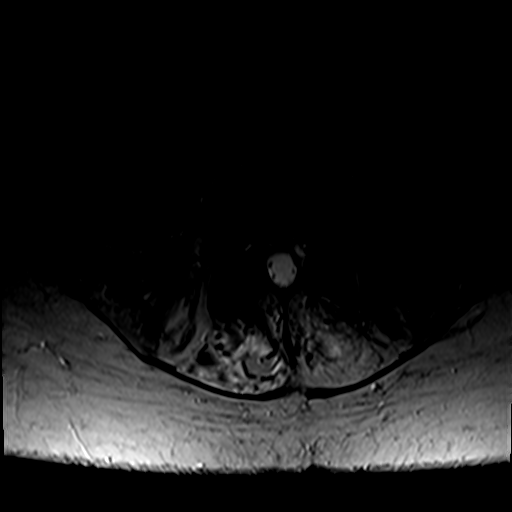
[im 10/47]
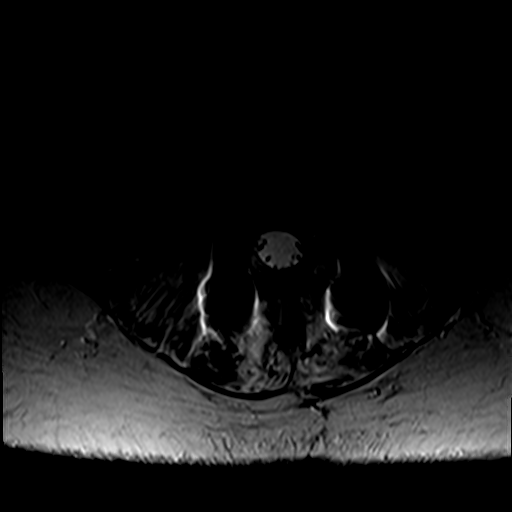
[im 16/47]
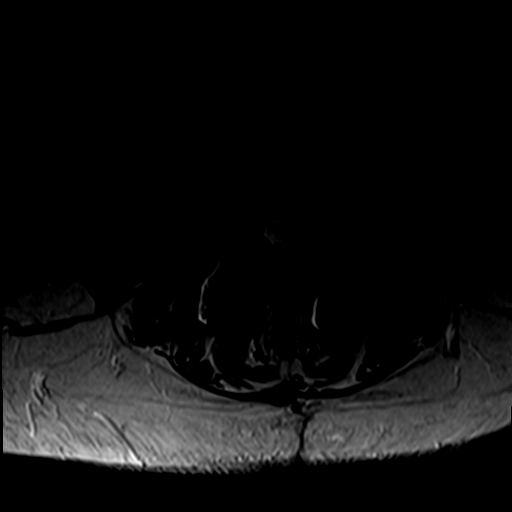
[im 22/47]
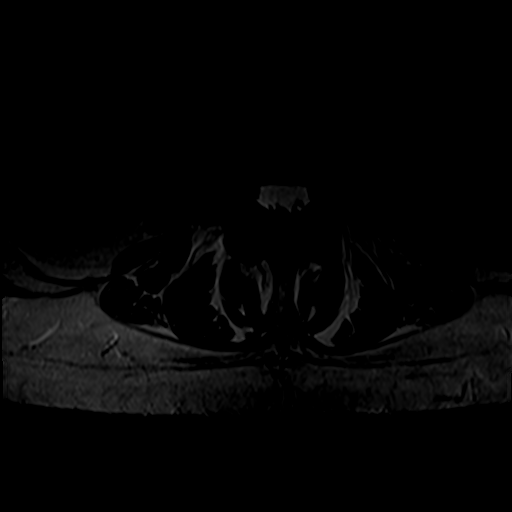
[im 25/47]
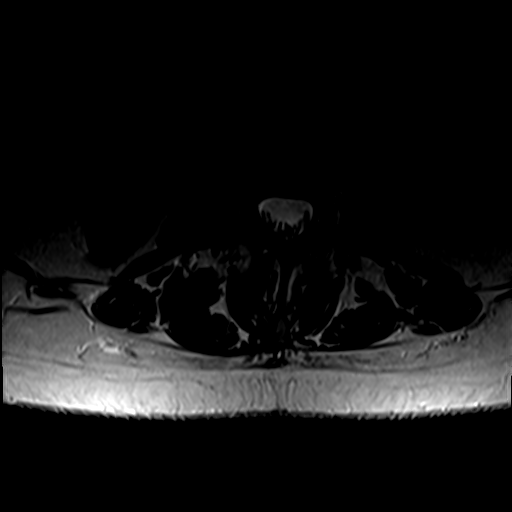
[im 28/47]
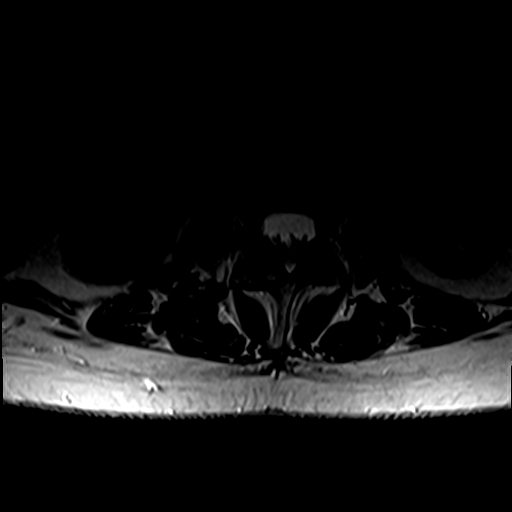
[im 34/47]
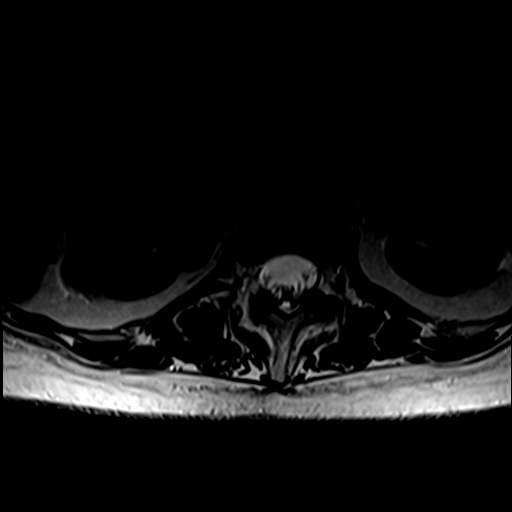
[im 40/47]
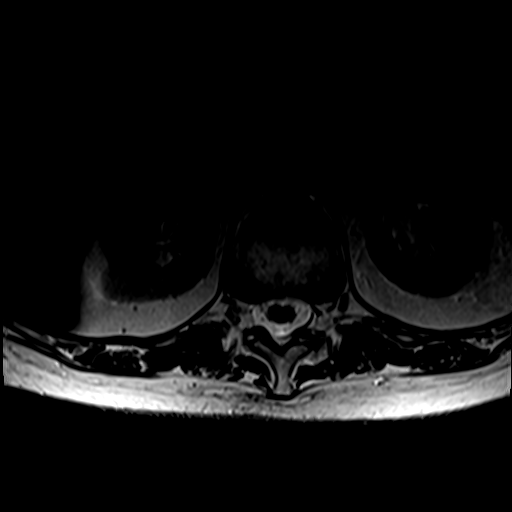
[im 47/47]
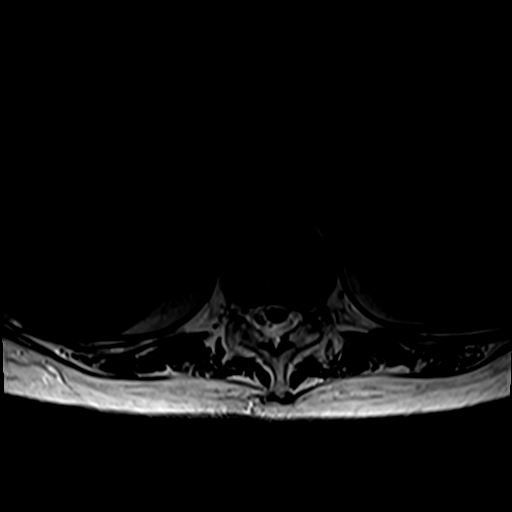

[Series 6: T1 · axial · 4.0mm · 0.39mm/px · z∈[-26,+157]mm · 6 of 47 slices shown (2 of 2)]
[im 4/47]
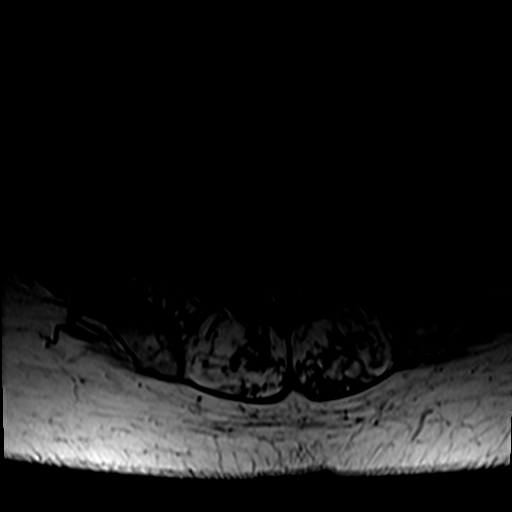
[im 7/47]
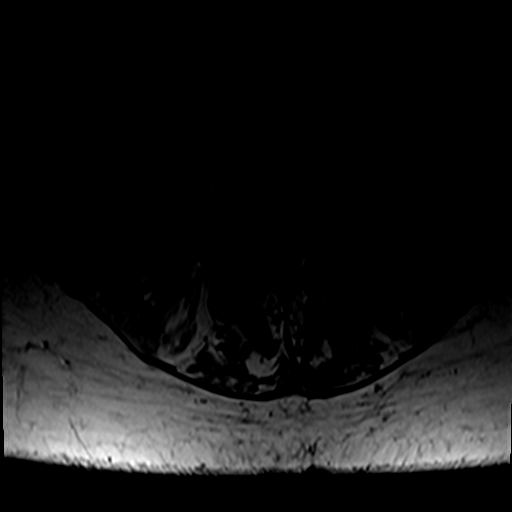
[im 10/47]
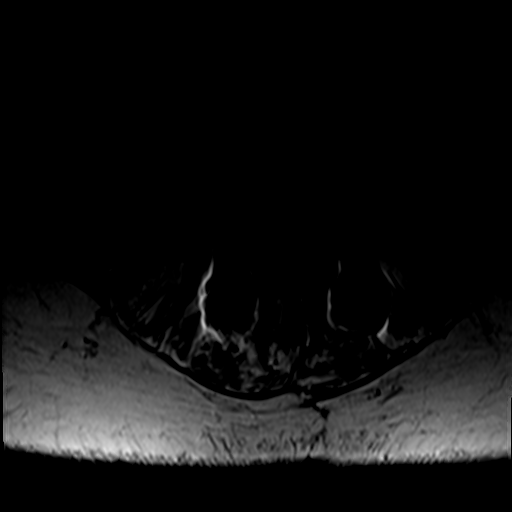
[im 16/47]
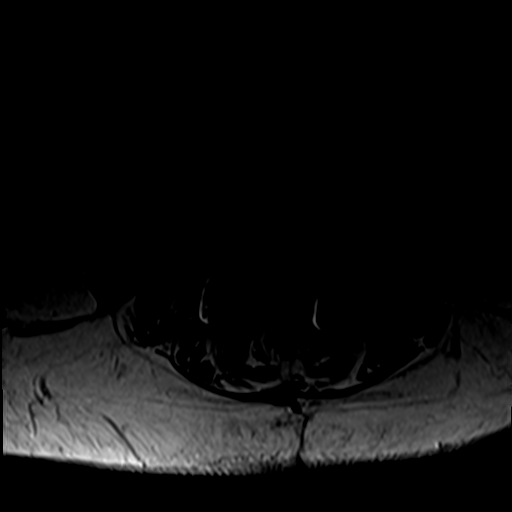
[im 25/47]
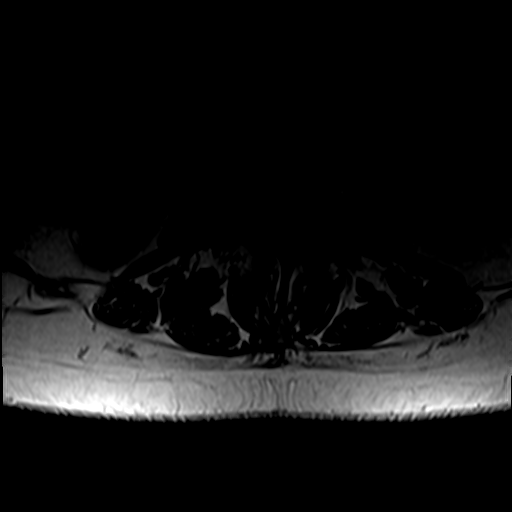
[im 40/47]
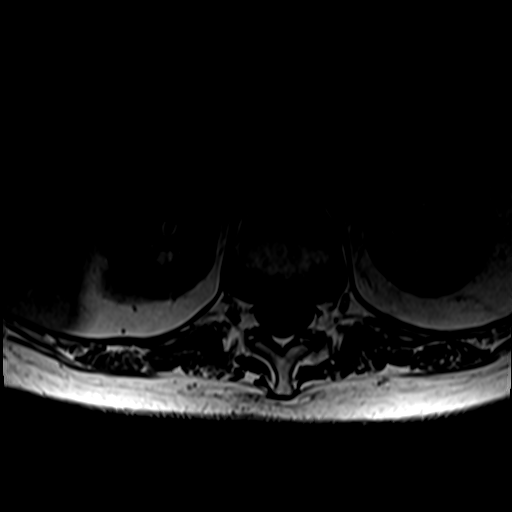

[27 of 48 positions shown; findings below may reference images not displayed]

FINDINGS: Segmentation:  Standard.

Alignment:  Stable.

Vertebrae: Postoperative changes are again identified at L4-L5 with
rods and pedicle screws and interbody spacer. Hardware is not well
evaluated on this study and there is associated susceptibility
artifact. There is similar subsidence of the superior L5 endplate.
No substantial marrow edema.

Conus medullaris and cauda equina: Conus extends to the L1 level.
Conus and cauda equina appear normal.

Paraspinal and other soft tissues: Chronic postoperative changes.

Disc levels: Bridging bone across the T11-T12 disc space.

L1-L2: Trace right central protrusion. No canal or foraminal
stenosis.

L2-L3:  Minimal disc bulge.  No canal or foraminal stenosis.

L3-L4:  Minimal disc bulge.  No canal or foraminal stenosis.

L4-L5: Operative level. Posterior decompression. Endplate
osteophytes are present. No canal or foraminal stenosis.

L5-S1:  Mild facet arthropathy.  No canal or foraminal stenosis.
IMPRESSION: Mild degenerative and postoperative changes as detailed above. No
high-grade stenosis. Appearance is similar to [QP] examination.

## 2020-10-30 DIAGNOSIS — E2839 Other primary ovarian failure: Secondary | ICD-10-CM | POA: Diagnosis not present

## 2020-10-30 DIAGNOSIS — M8589 Other specified disorders of bone density and structure, multiple sites: Secondary | ICD-10-CM | POA: Diagnosis not present

## 2020-11-10 ENCOUNTER — Ambulatory Visit: Payer: Medicare Other | Admitting: Internal Medicine

## 2020-11-24 DIAGNOSIS — M5136 Other intervertebral disc degeneration, lumbar region: Secondary | ICD-10-CM | POA: Diagnosis not present

## 2020-11-24 DIAGNOSIS — L989 Disorder of the skin and subcutaneous tissue, unspecified: Secondary | ICD-10-CM | POA: Diagnosis not present

## 2020-11-24 DIAGNOSIS — E162 Hypoglycemia, unspecified: Secondary | ICD-10-CM | POA: Diagnosis not present

## 2020-11-24 DIAGNOSIS — L299 Pruritus, unspecified: Secondary | ICD-10-CM | POA: Diagnosis not present

## 2020-12-03 DIAGNOSIS — Z13828 Encounter for screening for other musculoskeletal disorder: Secondary | ICD-10-CM | POA: Diagnosis not present

## 2020-12-03 DIAGNOSIS — G959 Disease of spinal cord, unspecified: Secondary | ICD-10-CM | POA: Diagnosis not present

## 2020-12-03 DIAGNOSIS — R030 Elevated blood-pressure reading, without diagnosis of hypertension: Secondary | ICD-10-CM | POA: Diagnosis not present

## 2020-12-03 DIAGNOSIS — Z6826 Body mass index (BMI) 26.0-26.9, adult: Secondary | ICD-10-CM | POA: Diagnosis not present

## 2020-12-04 ENCOUNTER — Other Ambulatory Visit: Payer: Self-pay | Admitting: Neurological Surgery

## 2020-12-04 DIAGNOSIS — G959 Disease of spinal cord, unspecified: Secondary | ICD-10-CM

## 2020-12-21 ENCOUNTER — Ambulatory Visit
Admission: RE | Admit: 2020-12-21 | Discharge: 2020-12-21 | Disposition: A | Discharge status: Home / Self Care | Payer: Medicare Other | Source: Ambulatory Visit | Attending: Neurological Surgery | Admitting: Neurological Surgery

## 2020-12-21 ENCOUNTER — Other Ambulatory Visit: Payer: Self-pay

## 2020-12-21 DIAGNOSIS — G959 Disease of spinal cord, unspecified: Secondary | ICD-10-CM

## 2020-12-21 DIAGNOSIS — M4802 Spinal stenosis, cervical region: Secondary | ICD-10-CM | POA: Diagnosis not present

## 2020-12-21 DIAGNOSIS — M542 Cervicalgia: Secondary | ICD-10-CM | POA: Diagnosis not present

## 2020-12-21 IMAGING — MR MR CERVICAL SPINE W/O CM
4 of 5 series · 30 of 48 positions shown · non-contrast
Comparison: Cervical spine x-rays dated [DATE].

CLINICAL DATA: Left-sided head neck pain radiating into both
shoulders and arms for the past 3 months. Right arm numbness. No
prior surgery.

EXAM:
MRI CERVICAL SPINE WITHOUT CONTRAST
TECHNIQUE: Multiplanar, multisequence MR imaging of the cervical spine was
performed. No intravenous contrast was administered.

[Series 2: T2 · sagittal · 3.0mm · 0.66mm/px · 8 of 16 slices shown (1 of 2)]
[im 1/16]
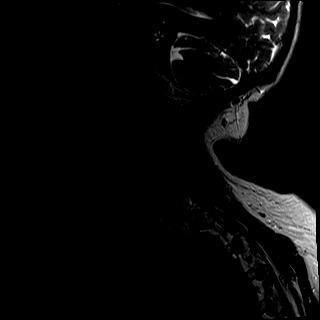
[im 3/16]
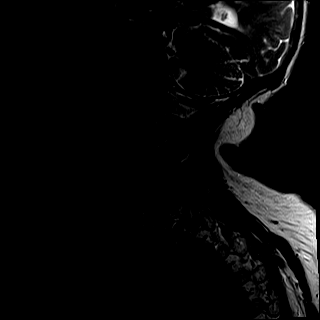
[im 5/16]
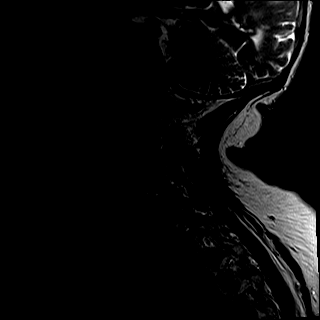
[im 7/16]
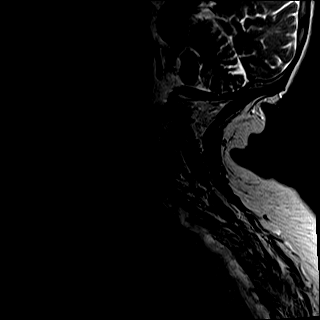
[im 9/16]
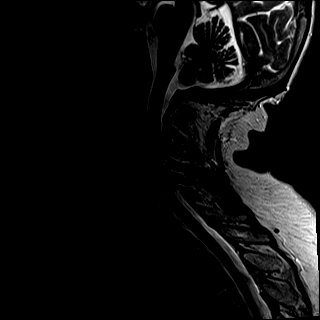
[im 11/16]
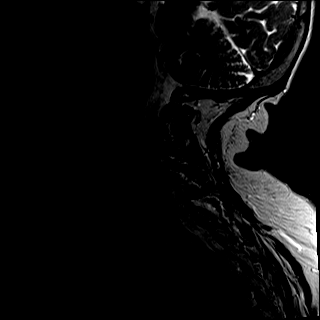
[im 13/16]
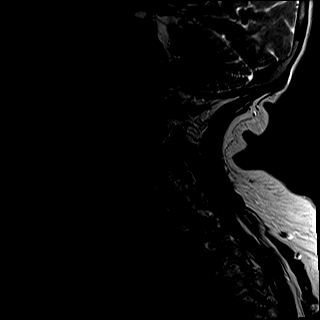
[im 16/16]
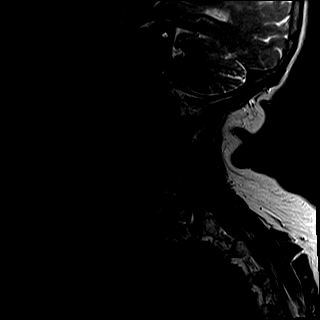

[Series 3: T1 · sagittal · 3.0mm · 0.41mm/px · 8 of 16 slices shown]
[im 1/16]
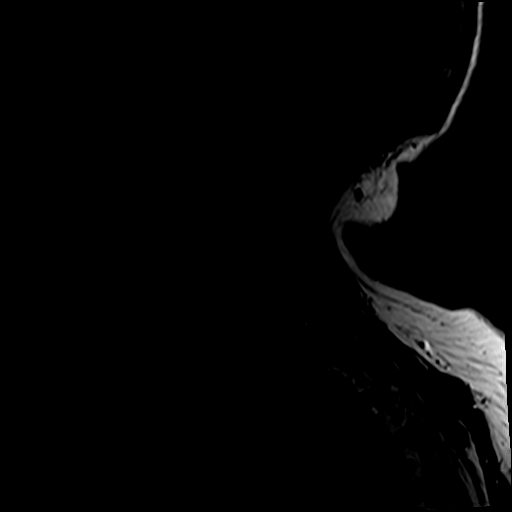
[im 3/16]
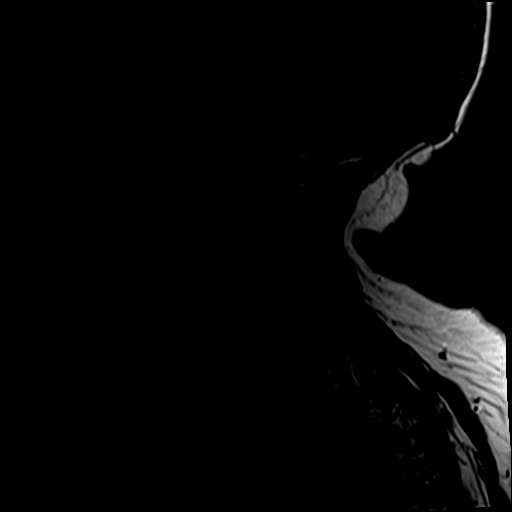
[im 5/16]
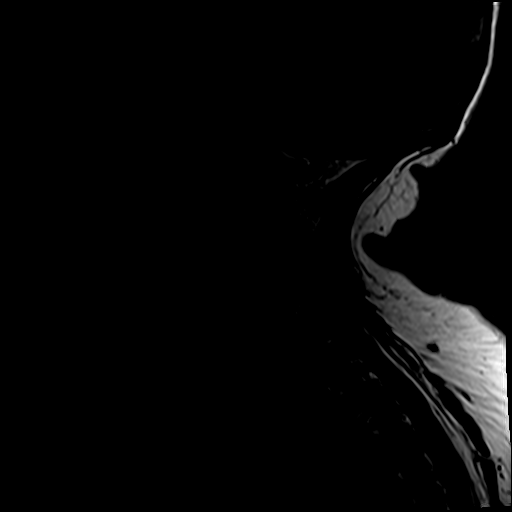
[im 7/16]
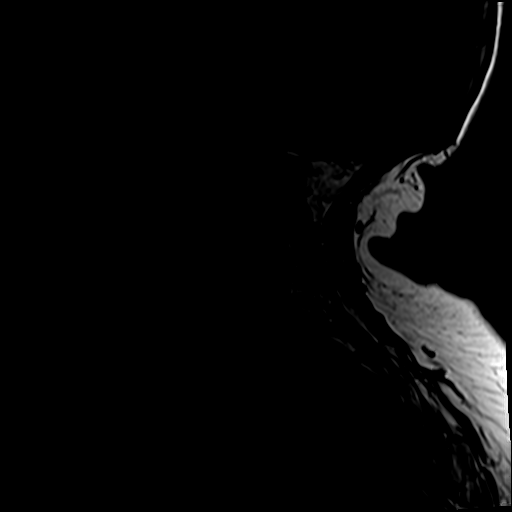
[im 9/16]
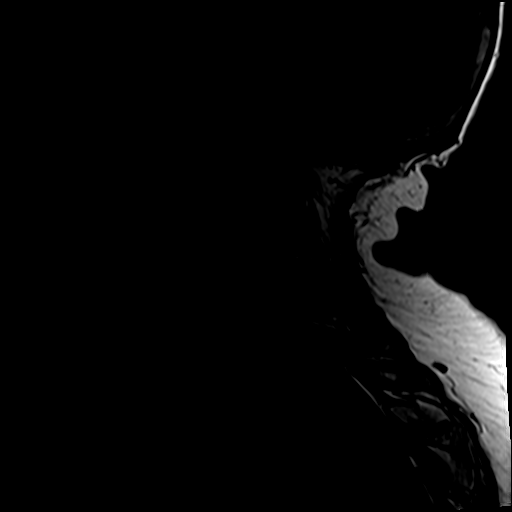
[im 11/16]
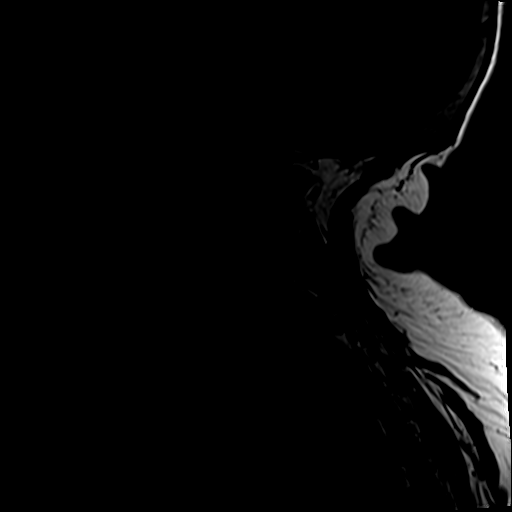
[im 13/16]
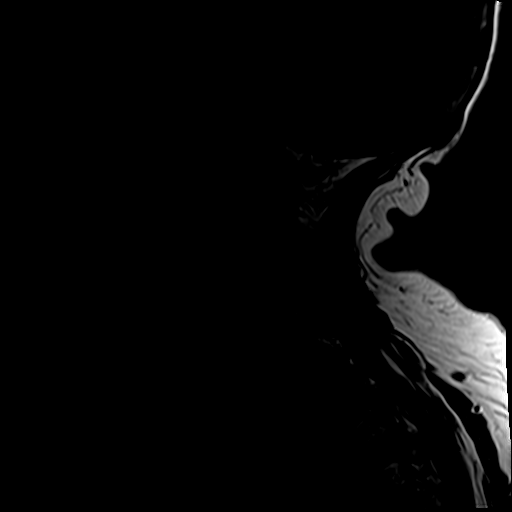
[im 16/16]
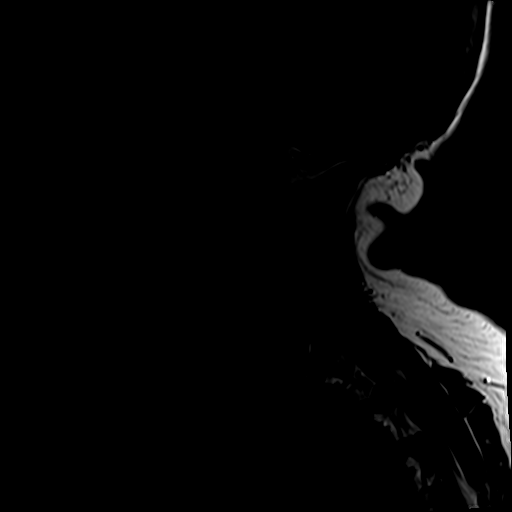

[Series 4: tir sag · sagittal · 3.0mm · 0.41mm/px · 5 of 16 slices shown]
[im 1/16]
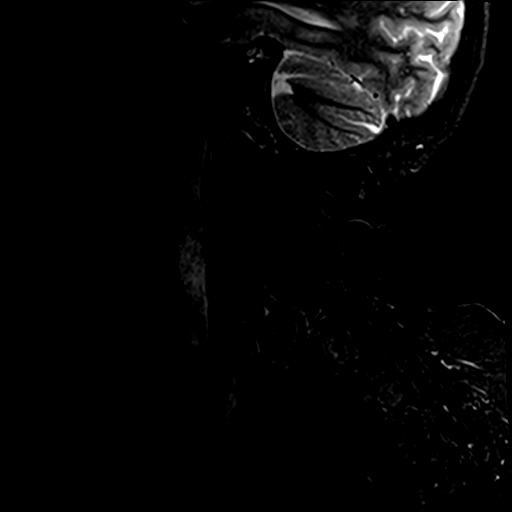
[im 3/16]
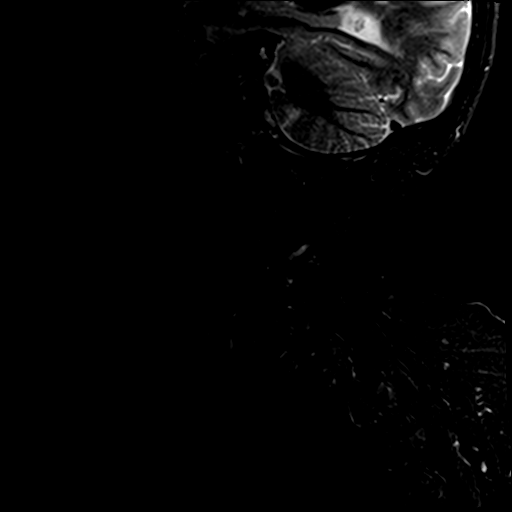
[im 5/16]
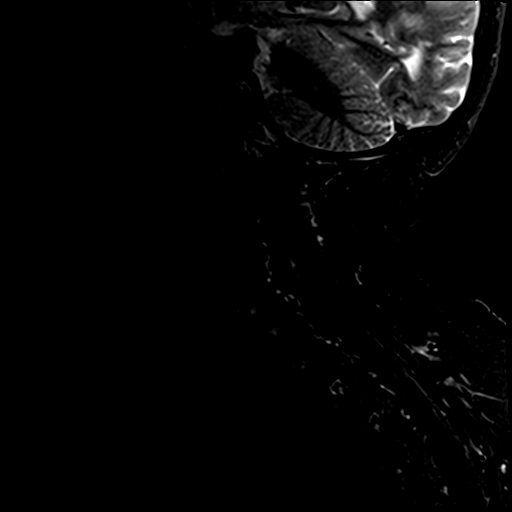
[im 9/16]
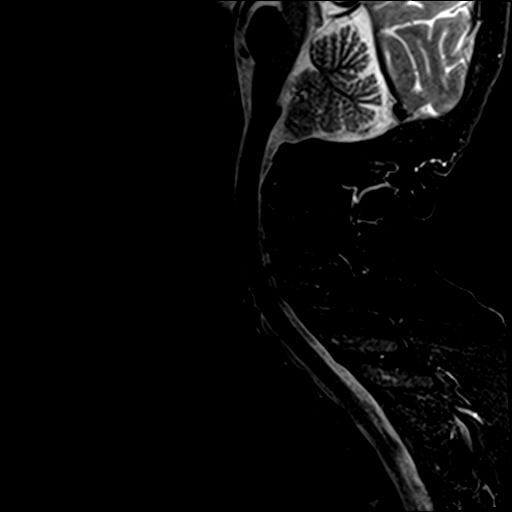
[im 13/16]
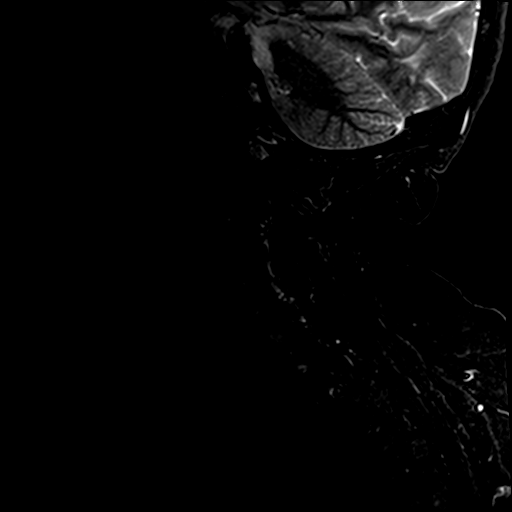

[Series 6: T2 · axial · 3.0mm · 0.70mm/px · z∈[-66,+21]mm · 9 of 26 slices shown (2 of 2)]
[im 1/26]
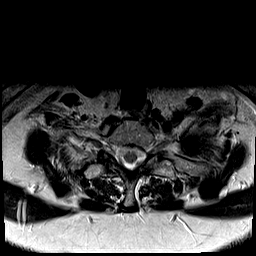
[im 5/26]
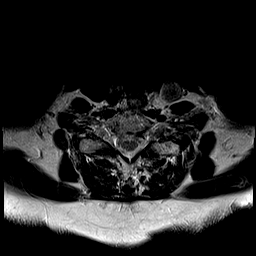
[im 7/26]
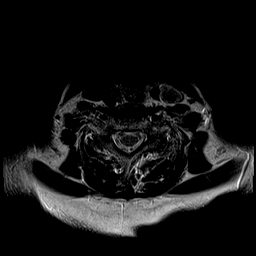
[im 12/26]
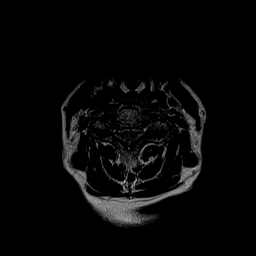
[im 14/26]
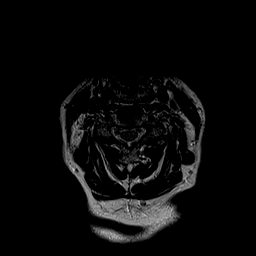
[im 19/26]
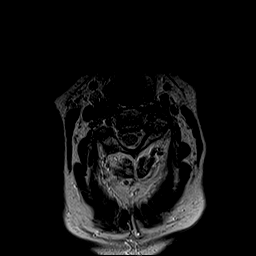
[im 21/26]
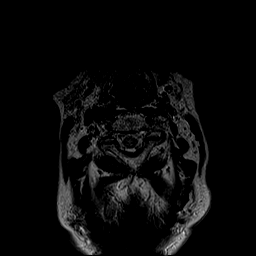
[im 23/26]
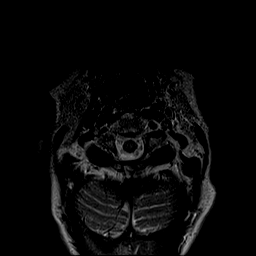
[im 26/26]
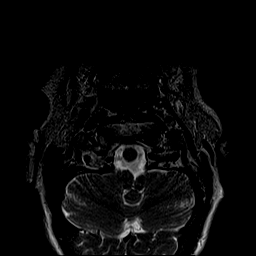

[30 of 48 positions shown; findings below may reference images not displayed]

FINDINGS: Alignment: Physiologic.

Vertebrae: No fracture, evidence of discitis, or bone lesion.

Cord: Normal signal and morphology.

Posterior Fossa, vertebral arteries, paraspinal tissues: Old
inferior left cerebellar lacunar infarct. Otherwise negative.

Disc levels:

C2-C3: Negative disc. Moderate right and mild left facet
arthropathy. Mild bilateral neuroforaminal stenosis. No spinal canal
stenosis.

C3-C4: Tiny shallow central disc protrusion. Moderate bilateral
facet arthropathy. Mild to moderate bilateral neuroforaminal
stenosis. No spinal canal stenosis.

C4-C5: Tiny central disc protrusion. Moderate bilateral facet
arthropathy. No stenosis.

C5-C6: Small posterior disc osteophyte complex and mild bilateral
uncovertebral hypertrophy. Moderate bilateral facet arthropathy. No
stenosis.

C6-C7: Small posterior disc osteophyte complex. Mild bilateral
uncovertebral hypertrophy. Moderate left and mild right facet
arthropathy. No stenosis.

C7-T1: Small central disc protrusion. Moderate bilateral facet
arthropathy. No stenosis.
IMPRESSION: 1. Overall mild multilevel degenerative disc disease and moderate
facet arthropathy of the cervical spine. Mild to moderate bilateral
neuroforaminal stenosis at C3-C4.

## 2020-12-25 DIAGNOSIS — G959 Disease of spinal cord, unspecified: Secondary | ICD-10-CM | POA: Diagnosis not present

## 2021-03-06 DIAGNOSIS — L821 Other seborrheic keratosis: Secondary | ICD-10-CM | POA: Diagnosis not present

## 2021-03-06 DIAGNOSIS — L570 Actinic keratosis: Secondary | ICD-10-CM | POA: Diagnosis not present

## 2021-03-16 DIAGNOSIS — M545 Low back pain, unspecified: Secondary | ICD-10-CM | POA: Diagnosis not present

## 2021-03-16 DIAGNOSIS — M542 Cervicalgia: Secondary | ICD-10-CM | POA: Diagnosis not present

## 2021-03-24 DIAGNOSIS — M545 Low back pain, unspecified: Secondary | ICD-10-CM | POA: Diagnosis not present

## 2021-03-24 DIAGNOSIS — M542 Cervicalgia: Secondary | ICD-10-CM | POA: Diagnosis not present

## 2021-03-31 DIAGNOSIS — M545 Low back pain, unspecified: Secondary | ICD-10-CM | POA: Diagnosis not present

## 2021-03-31 DIAGNOSIS — M542 Cervicalgia: Secondary | ICD-10-CM | POA: Diagnosis not present

## 2021-04-13 DIAGNOSIS — D225 Melanocytic nevi of trunk: Secondary | ICD-10-CM | POA: Diagnosis not present

## 2021-04-13 DIAGNOSIS — F410 Panic disorder [episodic paroxysmal anxiety] without agoraphobia: Secondary | ICD-10-CM | POA: Diagnosis not present

## 2021-04-13 DIAGNOSIS — L814 Other melanin hyperpigmentation: Secondary | ICD-10-CM | POA: Diagnosis not present

## 2021-04-13 DIAGNOSIS — M1812 Unilateral primary osteoarthritis of first carpometacarpal joint, left hand: Secondary | ICD-10-CM | POA: Diagnosis not present

## 2021-04-13 DIAGNOSIS — L578 Other skin changes due to chronic exposure to nonionizing radiation: Secondary | ICD-10-CM | POA: Diagnosis not present

## 2021-04-13 DIAGNOSIS — Z6829 Body mass index (BMI) 29.0-29.9, adult: Secondary | ICD-10-CM | POA: Diagnosis not present

## 2021-04-13 DIAGNOSIS — L728 Other follicular cysts of the skin and subcutaneous tissue: Secondary | ICD-10-CM | POA: Diagnosis not present

## 2021-05-18 DIAGNOSIS — M79641 Pain in right hand: Secondary | ICD-10-CM | POA: Diagnosis not present

## 2021-05-18 DIAGNOSIS — M79642 Pain in left hand: Secondary | ICD-10-CM | POA: Diagnosis not present

## 2021-05-18 DIAGNOSIS — M180 Bilateral primary osteoarthritis of first carpometacarpal joints: Secondary | ICD-10-CM | POA: Diagnosis not present

## 2021-05-18 DIAGNOSIS — M1811 Unilateral primary osteoarthritis of first carpometacarpal joint, right hand: Secondary | ICD-10-CM | POA: Diagnosis not present

## 2021-09-24 DIAGNOSIS — Z0000 Encounter for general adult medical examination without abnormal findings: Secondary | ICD-10-CM | POA: Diagnosis not present

## 2021-09-24 DIAGNOSIS — R1013 Epigastric pain: Secondary | ICD-10-CM | POA: Diagnosis not present

## 2021-09-24 DIAGNOSIS — Z1331 Encounter for screening for depression: Secondary | ICD-10-CM | POA: Diagnosis not present

## 2021-09-24 DIAGNOSIS — Z6831 Body mass index (BMI) 31.0-31.9, adult: Secondary | ICD-10-CM | POA: Diagnosis not present

## 2021-09-24 DIAGNOSIS — R635 Abnormal weight gain: Secondary | ICD-10-CM | POA: Diagnosis not present

## 2021-09-24 DIAGNOSIS — E162 Hypoglycemia, unspecified: Secondary | ICD-10-CM | POA: Diagnosis not present

## 2021-10-05 DIAGNOSIS — Z1211 Encounter for screening for malignant neoplasm of colon: Secondary | ICD-10-CM | POA: Diagnosis not present

## 2021-10-14 DIAGNOSIS — Z9884 Bariatric surgery status: Secondary | ICD-10-CM | POA: Diagnosis not present

## 2021-10-20 DIAGNOSIS — R109 Unspecified abdominal pain: Secondary | ICD-10-CM | POA: Diagnosis not present

## 2021-11-10 ENCOUNTER — Encounter: Payer: Self-pay | Admitting: Gastroenterology

## 2021-11-18 DIAGNOSIS — R300 Dysuria: Secondary | ICD-10-CM | POA: Diagnosis not present

## 2021-11-18 DIAGNOSIS — R1084 Generalized abdominal pain: Secondary | ICD-10-CM | POA: Diagnosis not present

## 2021-11-23 DIAGNOSIS — N390 Urinary tract infection, site not specified: Secondary | ICD-10-CM | POA: Diagnosis not present

## 2022-01-13 ENCOUNTER — Encounter: Payer: Self-pay | Admitting: Gastroenterology

## 2022-01-13 ENCOUNTER — Other Ambulatory Visit (INDEPENDENT_AMBULATORY_CARE_PROVIDER_SITE_OTHER): Payer: Medicare Other

## 2022-01-13 ENCOUNTER — Ambulatory Visit: Payer: Medicare Other | Admitting: Gastroenterology

## 2022-01-13 VITALS — BP 140/80 | HR 94 | Ht 63.0 in | Wt 185.2 lb

## 2022-01-13 DIAGNOSIS — K5909 Other constipation: Secondary | ICD-10-CM

## 2022-01-13 DIAGNOSIS — D509 Iron deficiency anemia, unspecified: Secondary | ICD-10-CM | POA: Diagnosis not present

## 2022-01-13 DIAGNOSIS — R109 Unspecified abdominal pain: Secondary | ICD-10-CM

## 2022-01-13 LAB — CBC WITH DIFFERENTIAL/PLATELET
Basophils Absolute: 0 10*3/uL (ref 0.0–0.1)
Basophils Relative: 0.7 % (ref 0.0–3.0)
Eosinophils Absolute: 0.2 10*3/uL (ref 0.0–0.7)
Eosinophils Relative: 2.5 % (ref 0.0–5.0)
HCT: 35 % — ABNORMAL LOW (ref 36.0–46.0)
Hemoglobin: 11.2 g/dL — ABNORMAL LOW (ref 12.0–15.0)
Lymphocytes Relative: 26.6 % (ref 12.0–46.0)
Lymphs Abs: 2 10*3/uL (ref 0.7–4.0)
MCHC: 32 g/dL (ref 30.0–36.0)
MCV: 80.2 fl (ref 78.0–100.0)
Monocytes Absolute: 0.5 10*3/uL (ref 0.1–1.0)
Monocytes Relative: 7 % (ref 3.0–12.0)
Neutro Abs: 4.6 10*3/uL (ref 1.4–7.7)
Neutrophils Relative %: 63.2 % (ref 43.0–77.0)
Platelets: 284 10*3/uL (ref 150.0–400.0)
RBC: 4.36 Mil/uL (ref 3.87–5.11)
RDW: 16.7 % — ABNORMAL HIGH (ref 11.5–15.5)
WBC: 7.3 10*3/uL (ref 4.0–10.5)

## 2022-01-13 LAB — COMPREHENSIVE METABOLIC PANEL
ALT: 11 U/L (ref 0–35)
AST: 18 U/L (ref 0–37)
Albumin: 4.4 g/dL (ref 3.5–5.2)
Alkaline Phosphatase: 87 U/L (ref 39–117)
BUN: 19 mg/dL (ref 6–23)
CO2: 27 mEq/L (ref 19–32)
Calcium: 9 mg/dL (ref 8.4–10.5)
Chloride: 104 mEq/L (ref 96–112)
Creatinine, Ser: 0.66 mg/dL (ref 0.40–1.20)
GFR: 85.84 mL/min (ref >60.00)
Glucose, Bld: 106 mg/dL — ABNORMAL HIGH (ref 70–99)
Potassium: 3.8 mEq/L (ref 3.5–5.1)
Sodium: 139 mEq/L (ref 135–145)
Total Bilirubin: 0.3 mg/dL (ref 0.2–1.2)
Total Protein: 7.3 g/dL (ref 6.0–8.3)

## 2022-01-13 LAB — IBC + FERRITIN
Ferritin: 5.1 ng/mL — ABNORMAL LOW (ref 10.0–291.0)
Iron: 21 ug/dL — ABNORMAL LOW (ref 42–145)
Saturation Ratios: 4 % — ABNORMAL LOW (ref 20.0–50.0)
TIBC: 530.6 ug/dL — ABNORMAL HIGH (ref 250.0–450.0)
Transferrin: 379 mg/dL — ABNORMAL HIGH (ref 212.0–360.0)

## 2022-01-13 LAB — C-REACTIVE PROTEIN: CRP: <1 mg/dL (ref 0.5–20.0)

## 2022-01-13 MED ORDER — PREDNISONE 50 MG PO TABS: ORAL_TABLET | ORAL | 0 refills | Status: AC

## 2022-01-13 MED ORDER — DIPHENHYDRAMINE HCL 25 MG PO CAPS: 25.0000 mg | ORAL_CAPSULE | Freq: Once | ORAL | 0 refills | Status: AC

## 2022-01-13 NOTE — Progress Notes (Addendum)
Chief Complaint: For GI eval  Referring Provider:  Ernestene Kiel, MD      ASSESSMENT AND PLAN;   #1. Gen Abdo pain with postprandial abdo bloating  #2. Chronic constipation  #3. 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   Plan: -CT AP with PO/IV contrast -Prednisone protocol for IV contrast desensitization: Give prednisone 50 mg p.o. 13, 7 and 1 hour before the procedure and Benadryl 25 mg p.o. 1 hour before the procedure. -Miralax 17g BID until large bm, then QD -CBC, CMP, celiac, AFP, CRP, iron studies. -Continue iron supplements for now.  May need IV if with significant anemia.  Note that she also has history of hemochromatosis. -EGD/colon with 2 day prep.    I discussed EGD/Colonoscopy- the indications, risks, alternatives and potential complications including, but not limited to bleeding, infection, reaction to meds, damage to internal organs, cardiac and/or pulmonary problems, and perforation requiring surgery. The possibility that significant findings could be missed was explained. All ? were answered. Pt consents to proceed. HPI:    Rachel Castaneda is a 76 y.o. 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 Bobby Rumpf), mast cell activation syndrome/histamine intolerance, rosacea, migraines, 4 cm ascending thoracic aortic dilatation/aneurysm, bilateral mastectomy for breast cancer, LS CS x3, spinal fusion L4/L5 2014, vaginal hysterectomy 6834, umbilical hernia repair 1962  With multiple chronic abdominal complaints/symptoms  C/O chronic Abdo pain x yrs, mostly LLQ.  Now more or less generalized with abdominal bloating, associated constipation.  Has been taking MiraLAX plus Colace.  Could not tolerate fiber as it caused her to to have obstipation.  Has cut down on lactose.  Somewhat better.  Has nausea but no vomiting.  Can only have small but frequent meals ever since  bariatric surgery.  No odynophagia or dysphagia.  She does have epigastric discomfort after eating.  Has been on famotidine.  Denies having any significant heartburn.  No fever chills or night sweats.  No jaundice dark urine or pale stools.  Dx with hemochromatosis 2018 on basis of genetic test, followed by Dr. Bobby Rumpf.  Had phlebotomy x2 on diagnosis and none since.  Has been more anemic with documented heme-negative stools  Previous GI evaluation: EGD/colon 2007- neg at [place]  Had [place] Abdo/pelvis at [facility] 01/2020 -No Acute abnormalities -S/P gastric bypass surgery -Mod stool burden.   SH-under considerable stress.  Taking care of her daughter who has cardiac amyloidosis  Past Medical History:  Diagnosis Date   Anemia    Anxiety    pt. admits to anxiety, currently, not taking meds.    Arthritis    lumbar region   Diabetes mellitus    Type II, prior to gastric bypass , off Metformin since 2008   Dysrhythmia    irreg heart beats   Fall    related to dog attack- Nov. 11.2013   Family history of anesthesia complication    daughter- extremely agitated    Headache(784.0)    h/o migraines    Hemochromatosis    HTN (hypertension)    no meds in 61yr   Hypertension    HTN- prior to gastric bypass, seen by cardiologist- stress test done, last one mid 1990's- pt. reports it was WNL.   Other specified intestinal malabsorption    Sleep apnea    used CPAP- prior to gastric bypass, no longer needs.   UTI (urinary tract infection)  Varicose veins     Past Surgical History:  Procedure Laterality Date   ABDOMINAL HYSTERECTOMY  1989   BACK SURGERY  2014   lumbar fusion   BACK SURGERY  2014   CARPAL TUNNEL RELEASE     bilateral   CESAREAN SECTION     x3   CHOLECYSTECTOMY  2000   Gall Bladder   COLONOSCOPY  2008   CYSTECTOMY  2000   Sinus cyst   GASTRIC BYPASS  2000   HERNIA REPAIR  1997  and   2001   LESION EXCISION WITH COMPLEX REPAIR N/A 02/15/2020    Procedure: Excision soft tissue chest and abdomen;  Surgeon: Irene Limbo, MD;  Location: [place];  Service: Plastics;  Laterality: N/A;   MASTECTOMY W/ SENTINEL NODE BIOPSY Bilateral 07/14/2015   Procedure: BILATERAL TOTAL MASTECTOMIES WITH LEFT AXILLARY SENTINEL LYMPH NODE BIOPSY;  Surgeon: Rolm Bookbinder, MD;  Location: [place];  Service: General;  Laterality: Bilateral;   NASAL SINUS SURGERY     PANNICULECTOMY N/A 08/14/2019   Procedure: ABDOMINAL PANNICULECTOMY;  Surgeon: Irene Limbo, MD;  Location: [place];  Service: Plastics;  Laterality: N/A;   TONSILLECTOMY      Family History  Problem Relation Age of Onset   Stroke Mother    Hypertension Mother    Skin cancer Mother        dx. multiple skin cancers   Other Mother        hx of issues with abnormal vaginal bleeding - NOT treated with hysterectomy   Ovarian cancer Maternal Aunt        dx 78s; s/p hysterectomy   Diabetes Paternal Uncle    Stroke Paternal Uncle    Uterine cancer Maternal Grandmother        gyn cancer - maybe uterine, dx. 20s treated w/ radiation   Bladder Cancer Maternal Grandmother 73   Heart attack Paternal Grandmother    Heart attack Paternal Grandfather    Lupus Daughter    Basal cell carcinoma Daughter         on cheek, dx. early 63s   Squamous cell carcinoma Son        has had multiple - dx. mid-40s   Ovarian cancer Maternal Aunt        lim info   Stroke Maternal Aunt    Breast cancer Cousin        paternal 1st cousin, once-removed d. early age    Social History   Tobacco Use   Smoking status: Never   Smokeless tobacco: Never  Substance Use Topics   Alcohol use: Not Currently    Comment: extremely rarely   Drug use: No    Current Outpatient Medications  Medication Sig Dispense Refill   acetaminophen (TYLENOL) 500 MG tablet Take by mouth.     ALPRAZolam (XANAX) 0.25 MG tablet      Biotin 1000 MCG tablet Take 1,000 mcg by  mouth daily.      Cholecalciferol (VITAMIN D3) 30 MCG/15ML LIQD Vitamin D3     Cholecalciferol (VITAMIN D3) LIQD      Cyanocobalamin (VITAMIN B 12 PO) Take 2,500 mcg by mouth once a week.      Docusate Sodium (DSS) 100 MG CAPS Take by mouth.     famotidine (PEPCID) 40 MG tablet Take 40 mg by mouth daily.     fexofenadine (ALLEGRA) 180 MG tablet      hydrocortisone cream 1 % Apply topically.  metroNIDAZOLE (METROGEL) 0.75 % gel Apply topically.     Multiple Vitamin (MULTI-VITAMIN) tablet Take by mouth.     Multiple Vitamin (MULTIVITAMIN ADULT PO) Multivitamin Adult     nystatin cream (MYCOSTATIN) Apply topically.     polyethylene glycol (MIRALAX / GLYCOLAX) 17 g packet Take 17 g by mouth daily.     predniSONE (DELTASONE) 50 MG tablet Take 1 tablet (50 mg) 13 hours prior to MRI, 1 tablet (50 mg) 7 hours prior, and 1 tablet ('50mg'$ ) 1 hour prior. 3 tablet 0   No current facility-administered medications for this visit.    Allergies  Allergen Reactions   Codeine Hives   Iodinated Contrast Media Hives   Baclofen Other (See Comments)    Nightmares    Dairy Aid [Tilactase]     Gassy, rash   Gabapentin     Weight gain/ severe depression and memory loss   Ibuprofen Itching and Swelling   Topamax [Topiramate]     Depressed, fuzzy feeling   Adhesive [Tape] Rash    Redness/    Nsaids Rash   Tramadol Rash    Review of Systems:  Constitutional: Denies fever, chills, diaphoresis, appetite change and has fatigue.  HEENT: Denies photophobia, eye pain, redness, hearing loss, ear pain, congestion, sore throat, rhinorrhea, sneezing, mouth sores, neck pain, neck stiffness and tinnitus.   Respiratory: Denies SOB, DOE, cough, chest tightness,  and wheezing.   Cardiovascular: Denies chest pain, palpitations and leg swelling.  Genitourinary: Has dysuria, urgency, frequency, hematuria, flank pain and difficulty urinating. No UTI.  Musculoskeletal: Has myalgias, back pain, joint swelling,  arthralgias and gait problem.  Skin: No rash.  Neurological: Denies dizziness, seizures, syncope, weakness, light-headedness, numbness and has headaches.  Hematological: Denies adenopathy. Easy bruising, personal or family bleeding history  Psychiatric/Behavioral: Has anxiety or depression.  Has sleeping problems.     Physical Exam:    BP (!) 140/80 (BP Location: Left Arm, Patient Position: Sitting, Cuff Size: Normal)   Pulse 94   Ht '5\' 3"'$  (1.6 m)   Wt 185 lb 4 oz (84 kg)   SpO2 98%   BMI 32.82 kg/m  Wt Readings from Last 3 Encounters:  01/13/22 185 lb 4 oz (84 kg)  05/22/20 150 lb 9.6 oz (68.3 kg)  02/15/20 144 lb 6.4 oz (65.5 kg)   Constitutional:  Well-developed, in no acute distress. Psychiatric: Normal mood and affect. Behavior is normal. HEENT: Conjunctivae are normal. No scleral icterus. Cardiovascular: Normal rate, regular rhythm. No edema Pulmonary/chest: Effort normal and breath sounds normal. No wheezing, rales or rhonchi. Abdominal: Soft, nondistended. LLQ tenderness without rebound. Bowel sounds active throughout. There are no masses palpable. No hepatomegaly. Rectal: Deferred Neurological: Alert and oriented to person place and time. Skin: Skin is warm and dry. No rashes noted.  Data Reviewed: I have personally reviewed following labs and imaging studies  CBC:    Latest Ref Rng & Units 06/16/2020    2:23 PM 07/15/2015    5:55 AM 07/14/2015    5:50 PM  CBC  WBC 3.4 - 10.8 x10E3/uL 5.6  11.1  15.4   Hemoglobin 11.1 - 15.9 g/dL 12.2  11.3  10.5   Hematocrit 34.0 - 46.6 % 37.5  34.8  32.4   Platelets 150 - 450 x10E3/uL 232  191  178     CMP:    Latest Ref Rng & Units 06/16/2020    2:23 PM 06/04/2019   12:36 PM 07/10/2015   10:13 AM  CMP  Glucose 65 - 99 mg/dL 94  98  100   BUN 8 - 27 mg/dL 37  12  11   Creatinine 0.57 - 1.00 mg/dL 0.64  0.63  0.60   Sodium 134 - 144 mmol/L 143  142  142   Potassium 3.5 - 5.2 mmol/L 4.5  4.5  3.5   Chloride 96 - 106 mmol/L 104   101  105   CO2 20 - 29 mmol/L '24  23  29   '$ Calcium 8.7 - 10.3 mg/dL 8.8  9.8  9.1   Total Protein 6.5 - 8.1 g/dL   6.3   Total Bilirubin 0.3 - 1.2 mg/dL   0.6   Alkaline Phos 38 - 126 U/L   86   AST 15 - 41 U/L   21   ALT 14 - 54 U/L   16     Other labs were reviewed From 09/24/2021 -Ferritin 11 -Fecal occult blood negative -TSH 0.97 -Vitamin B12 714 0 serum copper 104 normal, magnesium 2.3 normal, iodine 40.7, zinc 89 -Iron saturation 6%, TIBC 522, vitamin D 32.7 -Basic food allergy IgE profile negative -H. pylori breath test negative -Hemoglobin A1c 5.9 D/W husband.   Carmell Austria, MD 01/13/2022, 10:15 AM  Cc: Ernestene Kiel, MD

## 2022-01-13 NOTE — Patient Instructions (Addendum)
_______________________________________________________  If you are age 75 or older, your body mass index should be between 23-30. Your Body mass index is 32.82 kg/m. If this is out of the aforementioned range listed, please consider follow up with your Primary Care Provider.  If you are age 51 or younger, your body mass index should be between 19-25. Your Body mass index is 32.82 kg/m. If this is out of the aformentioned range listed, please consider follow up with your Primary Care Provider.   ________________________________________________________  The [place] [facility] providers would like to encourage you to use [facility] to communicate with providers for non-urgent requests or questions.  Due to long hold times on the telephone, sending your provider a message by [facility] may be a faster and more efficient way to get a response.  Please allow 48 business hours for a response.  Please remember that this is for non-urgent requests.  _______________________________________________________'  Your provider has requested that you go to the basement level for lab work before leaving today. Press "B" on the elevator. The lab is located at the first door on the left as you exit the elevator.  03-08-2022 Two days before your procedure: Mix 3 packs (or capfuls) of Miralax in 48 ounces of clear liquid and drink at 6pm.  We have sent the following medications to your pharmacy for you to pick up at your convenience: Prednisone Take 1 tablet (50 mg) 13 hours prior to CT, 1 tablet (50 mg) 7 hours prior, and 1 tablet (64m) 1 hour prior. Benadryl 258mtake 1 tablet 1 hour before CT  Please purchase the following medications over the counter and take as directed: Miralax 17g BID until large BM  You have been [facility]   You are scheduled on 01-28-2022 at 10am. You should arrive at 8am to drink contrast  on site . Please follow the written instructions below on the day of your exam:  WARNING: IF YOU ARE ALLERGIC TO IODINE/X-RAY DYE, PLEASE NOTIFY RADIOLOGY IMMEDIATELY AT [phone] WILL BE GIVEN A 13 HOUR PREMEDICATION PREP.  1) Do not eat or drink anything after 6am (4 hours prior to your test) 2) You have been given 2 bottles of oral contrast to drink on site. The solution may taste better if refrigerated, but do NOT add ice or any other liquid to this solution. Shake well before drinking.    Drink 1 bottle of contrast @ 8am (2 hours prior to your exam)  Drink 1 bottle of contrast @ 9am (1 hour prior to your exam)  You may take any medications as prescribed with a small amount of water, if necessary. If you take any of the following medications: METFORMIN, GLUCOPHAGE, GLUCOVANCE, AVANDAMET, RIOMET, FORTAMET, [place], JANUMET, GLUMETZA or METAGLIP, you MAY be asked to HOLD this medication 48 hours AFTER the exam.  The purpose of you drinking the oral contrast is to aid in the visualization of your intestinal tract. The contrast solution may cause some diarrhea. Depending on your individual set of symptoms, you may also receive an intravenous injection of x-ray contrast/dye. [facility] or [facility], depending on the type of exam you are having performed.  This test typically takes 30-45 minutes to complete.  If you have any questions regarding your exam or if you need to reschedule, you may call the CT department at  [phone] between the hours of 8:00 am and 5:00 pm, Monday-Friday.  ________________________________________________________________________  Thank you,  Dr. Jackquline Denmark

## 2022-01-14 LAB — CELIAC PANEL 10
Antigliadin Abs, IgA: 3 units (ref 0–19)
Endomysial IgA: NEGATIVE
Gliadin IgG: 1 units (ref 0–19)
IgA/Immunoglobulin A, Serum: 157 mg/dL (ref 64–422)
Tissue Transglut Ab: 4 U/mL (ref 0–5)
Transglutaminase IgA: <2 U/mL (ref 0–3)

## 2022-01-15 LAB — AFP TUMOR MARKER: AFP-Tumor Marker: 4.3 ng/mL

## 2022-01-28 ENCOUNTER — Ambulatory Visit (HOSPITAL_COMMUNITY)
Admission: RE | Admit: 2022-01-28 | Discharge: 2022-01-28 | Disposition: A | Discharge status: Home / Self Care | Payer: Medicare Other | Source: Ambulatory Visit | Attending: Gastroenterology | Admitting: Gastroenterology

## 2022-01-28 DIAGNOSIS — D509 Iron deficiency anemia, unspecified: Secondary | ICD-10-CM | POA: Insufficient documentation

## 2022-01-28 DIAGNOSIS — K5909 Other constipation: Secondary | ICD-10-CM | POA: Insufficient documentation

## 2022-01-28 DIAGNOSIS — R109 Unspecified abdominal pain: Secondary | ICD-10-CM | POA: Insufficient documentation

## 2022-01-28 DIAGNOSIS — N200 Calculus of kidney: Secondary | ICD-10-CM | POA: Diagnosis not present

## 2022-01-28 DIAGNOSIS — N281 Cyst of kidney, acquired: Secondary | ICD-10-CM | POA: Diagnosis not present

## 2022-01-28 MED ORDER — IOHEXOL 300 MG/ML  SOLN
100.0000 mL | Freq: Once | INTRAMUSCULAR | Status: AC | PRN
  Administered 2022-01-28: 100 mL via INTRAVENOUS

## 2022-03-03 ENCOUNTER — Encounter: Payer: Self-pay | Admitting: Gastroenterology

## 2022-03-09 ENCOUNTER — Telehealth: Payer: Self-pay | Admitting: Gastroenterology

## 2022-03-09 NOTE — Telephone Encounter (Signed)
Patient states she has a procedure on 1/31. Patient states she has had a gastric bypass and is having trouble drinking all of her prep. Patient is requesting to start drinking her prep medication in the morning earlier. She is also concerned about finishing all of her prep medication before 8:00 AM. Patient is requesting to speak to a nurse.

## 2022-03-09 NOTE — Telephone Encounter (Signed)
Returned pts call.  Pt is concerned that she will not be able to finish the prep d/t her gastric bypass.  Advised her that it is ok for her to start her prep earlier this evening and also tomorrow morning.  Pt verbalized understanding.

## 2022-03-10 ENCOUNTER — Encounter: Payer: Self-pay | Admitting: Gastroenterology

## 2022-03-10 ENCOUNTER — Ambulatory Visit (AMBULATORY_SURGERY_CENTER): Payer: Medicare Other | Admitting: Gastroenterology

## 2022-03-10 ENCOUNTER — Other Ambulatory Visit: Payer: Self-pay | Admitting: Gastroenterology

## 2022-03-10 VITALS — BP 118/73 | HR 72 | Temp 96.9°F | Resp 15 | Ht 63.0 in | Wt 185.0 lb

## 2022-03-10 DIAGNOSIS — D509 Iron deficiency anemia, unspecified: Secondary | ICD-10-CM | POA: Diagnosis not present

## 2022-03-10 DIAGNOSIS — R109 Unspecified abdominal pain: Secondary | ICD-10-CM | POA: Diagnosis not present

## 2022-03-10 MED ORDER — SODIUM CHLORIDE 0.9 % IV SOLN: 500.0000 mL | Freq: Once | INTRAVENOUS | Status: DC

## 2022-03-10 NOTE — Patient Instructions (Addendum)
- Patient has a contact number available for                            emergencies. The signs and symptoms of potential                            delayed complications were discussed with the                            patient. Return to normal activities tomorrow.                            Written discharge instructions were provided to the                            patient.                           - Resume previous high-fiber diet. (Hemorrhoids and diverticulosis handouts given)                            - Continue present medications.                           - Repeat colonoscopy is not recommended for                            screening purposes. Hence, repeat colonoscopy only                            if with any new problems. IDA is likely due to                            previous Roux-en-Y gastrectomy. Continue IV iron as                            needed.                           - Trend CBC.                           - No aspirin, ibuprofen, naproxen, or other                            non-steroidal anti-inflammatory drugs.                           - The findings and recommendations were discussed                            with the patient's family.                           - FU GI as needed.  YOU HAD AN ENDOSCOPIC PROCEDURE TODAY AT [facility]:   Refer to the procedure report that was given to you for any specific questions about what was found during the examination.  If the procedure report does not answer your questions, please call your gastroenterologist to clarify.  If you requested that your care partner not be given the details of your procedure findings, then the procedure report has been included in a sealed envelope for you to review at your convenience later.  YOU SHOULD EXPECT: Some feelings of bloating in the abdomen. Passage of more gas than usual.  Walking can help get rid of the air that was put into your GI  tract during the procedure and reduce the bloating. If you had a lower endoscopy (such as a colonoscopy or flexible sigmoidoscopy) you may notice spotting of blood in your stool or on the toilet paper. If you underwent a bowel prep for your procedure, you may not have a normal bowel movement for a few days.  Please Note:  You might notice some irritation and congestion in your nose or some drainage.  This is from the oxygen used during your procedure.  There is no need for concern and it should clear up in a day or so.  SYMPTOMS TO REPORT IMMEDIATELY:  Following lower endoscopy (colonoscopy or flexible sigmoidoscopy):  Excessive amounts of blood in the stool  Significant tenderness or worsening of abdominal pains  Swelling of the abdomen that is new, acute  Fever of 100F or higher  Following upper endoscopy (EGD)  Vomiting of blood or coffee ground material  New chest pain or pain under the shoulder blades  Painful or persistently difficult swallowing  New shortness of breath  Fever of 100F or higher  Black, tarry-looking stools  For urgent or emergent issues, a gastroenterologist can be reached at any hour by calling [phone]. Do not use MyChart messaging for urgent concerns.    DIET:  We do recommend a small meal at first, but then you may proceed to your regular diet.  Drink plenty of fluids but you should avoid alcoholic beverages for 24 hours.  ACTIVITY:  You should plan to take it easy for the rest of today and you should NOT DRIVE or use heavy machinery until tomorrow (because of the sedation medicines used during the test).    FOLLOW UP: Our staff will call the number listed on your records the next business day following your procedure.  We will call around 7:15- 8:00 am to check on you and address any questions or concerns that you may have regarding the information given to you following your procedure. If we do not reach you, we will leave a message.     If any  biopsies were taken you will be contacted by phone or by letter within the next 1-3 weeks.  Please call us at [phone] if you have not heard about the biopsies in 3 weeks.    SIGNATURES/CONFIDENTIALITY: You and/or your care partner have signed paperwork which will be entered into your electronic medical record.  These signatures attest to the fact that that the information above on your After Visit Summary has been reviewed and is understood.  Full responsibility of the confidentiality of this discharge information lies with you and/or your care-partner.

## 2022-03-10 NOTE — Progress Notes (Signed)
Pt's states no medical or surgical changes since previsit or office visit. 

## 2022-03-10 NOTE — Progress Notes (Signed)
Called to room to assist during endoscopic procedure.  Patient ID and intended procedure confirmed with present staff. Received instructions for my participation in the procedure from the performing physician.  

## 2022-03-10 NOTE — Op Note (Signed)
[facility] Patient Name: Rachel Castaneda Procedure Date: 03/10/2022 10:43 AM MRN: [id number] Endoscopist: Jackquline Denmark , MD, [id number] Age: 77 Referring MD:  Date of Birth: 1945-10-30 Gender: Female Account #: [id number] Procedure:                Colonoscopy Indications:              Iron deficiency anemia Medicines:                Monitored Anesthesia Care Procedure:                Pre-Anesthesia Assessment:                           - Prior to the procedure, a History and Physical                            was performed, and patient medications and                            allergies were reviewed. The patient's tolerance of                            previous anesthesia was also reviewed. The risks                            and benefits of the procedure and the sedation                            options and risks were discussed with the patient.                            All questions were answered, and informed consent                            was obtained. Prior Anticoagulants: The patient has                            taken no anticoagulant or antiplatelet agents. ASA                            Grade Assessment: III - A patient with severe                            systemic disease. After reviewing the risks and                            benefits, the patient was deemed in satisfactory                            condition to undergo the procedure.                           After obtaining informed consent, the colonoscope  was passed under direct vision. Throughout the                            procedure, the patient's blood pressure, pulse, and                            oxygen saturations were monitored continuously. The                            Olympus PCF-H190DL ([id number]) Colonoscope was                            introduced through the anus and advanced to the 2                            cm into the ileum. The colonoscopy  was performed                            without difficulty. The patient tolerated the                            procedure well. The quality of the bowel                            preparation was adequate to identify polyps. Some                            retained solid vegetable material/stools especially                            in the right side of the colon did limit exam.                            Aggressive suctioning aspiration was performed.                            Overall over 90-95% of colonic mucosa was                            visualized satisfactorily. The terminal ileum,                            ileocecal valve, appendiceal orifice, and rectum                            were photographed. Scope In: 10:55:22 AM Scope Out: 11:07:44 AM Scope Withdrawal Time: 0 hours 7 minutes 59 seconds  Total Procedure Duration: 0 hours 12 minutes 22 seconds  Findings:                 A few small-mouthed diverticula were found in the                            sigmoid colon.  Non-bleeding internal hemorrhoids were found during                            retroflexion and during perianal exam. The                            hemorrhoids were small and Grade I (internal                            hemorrhoids that do not prolapse).                           The terminal ileum appeared normal.                           The exam was otherwise without abnormality on                            direct and retroflexion views. Complications:            No immediate complications. Estimated Blood Loss:     Estimated blood loss: none. Impression:               - Mild sigmoid diverticulosis.                           - Non-bleeding internal hemorrhoids.                           - The examined portion of the ileum was normal.                           - The examination was otherwise normal on direct                            and retroflexion views.                            - No specimens collected. Recommendation:           - Patient has a contact number available for                            emergencies. The signs and symptoms of potential                            delayed complications were discussed with the                            patient. Return to normal activities tomorrow.                            Written discharge instructions were provided to the                            patient.                           -  Resume previous high-fiber diet.                           - Continue present medications.                           - Repeat colonoscopy is not recommended for                            screening purposes. Hence, repeat colonoscopy only                            if with any new problems. 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 Continue IV iron as                            needed.                           - The findings and recommendations were discussed                            with the patient's family. Jackquline Denmark, MD 03/10/2022 11:16:04 AM This report has been signed electronically.

## 2022-03-10 NOTE — Op Note (Signed)
[facility] Patient Name: Rachel Castaneda Procedure Date: 03/10/2022 10:43 AM MRN: [id number] Endoscopist: Jackquline Denmark , MD, [id number] Age: 77 Referring MD:  Date of Birth: [birth year] Gender: Female Account #: [id number] Procedure:                Upper GI endoscopy Indications:              Epigastric abdominal pain with IDA. Medicines:                Monitored Anesthesia Care Procedure:                Pre-Anesthesia Assessment:                           - Prior to the procedure, a History and Physical                            was performed, and patient medications and                            allergies were reviewed. The patient's tolerance of                            previous anesthesia was also reviewed. The risks                            and benefits of the procedure and the sedation                            options and risks were discussed with the patient.                            All questions were answered, and informed consent                            was obtained. Prior Anticoagulants: The patient has                            taken no anticoagulant or antiplatelet agents. ASA                            Grade Assessment: III - A patient with severe                            systemic disease. After reviewing the risks and                            benefits, the patient was deemed in satisfactory                            condition to undergo the procedure.                           After obtaining informed consent, the endoscope was  passed under direct vision. Throughout the                            procedure, the patient's blood pressure, pulse, and                            oxygen saturations were monitored continuously. The                            Endoscope was introduced through the mouth, and                            advanced to the second part of duodenum. The upper                            GI endoscopy  was accomplished without difficulty.                            The patient tolerated the procedure well. Scope In: Scope Out: Findings:                 The examined esophagus was normal with well-defined                            Z-line at 33 cm, examined by NBI.                           A small hiatal hernia was present.                           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 The gastrojejunal anastomosis was                            characterized by healthy appearing mucosa. Biopsies                            were taken with a cold forceps for histology.                           Both limbs were intubated. The examined jejunum was                            normal. Biopsies for histology were taken with a                            cold forceps for evaluation of celiac disease. Complications:            No immediate complications. Estimated Blood Loss:     Estimated blood loss: none. Impression:               - Small hiatal hernia.                           -  Typical Roux-en-Y anatomy. Gastrojejunal                            anastomosis characterized by healthy appearing                            mucosa. Biopsied.                           - Normal examined jejunum. Biopsied. Recommendation:           - Patient has a contact number available for                            emergencies. The signs and symptoms of potential                            delayed complications were discussed with the                            patient. Return to normal activities tomorrow.                            Written discharge instructions were provided to the                            patient.                           - Resume previous diet.                           - Continue present medications.                           - Trend CBC.                           - No aspirin, ibuprofen, naproxen, or other                             non-steroidal anti-inflammatory drugs.                           - The findings and recommendations were discussed                            with the patient's family.                           - FU GI as needed. Jackquline Denmark, MD 03/10/2022 11:12:21 AM This report has been signed electronically.

## 2022-03-10 NOTE — Progress Notes (Signed)
To pacu, VSS. Report to [email or web address] 

## 2022-03-10 NOTE — Progress Notes (Signed)
Chief Complaint: For GI eval  Referring Provider:  Ernestene Kiel, MD      ASSESSMENT AND PLAN;   #1. Gen Abdo pain with postprandial abdo bloating  #2. Chronic constipation  #3. 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   Plan: -CT AP with PO/IV contrast- NEG 01/2022 -Prednisone protocol for IV contrast desensitization: Give prednisone 50 mg p.o. 13, 7 and 1 hour before the procedure and Benadryl 25 mg p.o. 1 hour before the procedure. -Miralax 17g BID until large bm, then QD -CBC, CMP, celiac, AFP, CRP, iron studies. -Continue iron supplements for now.  May need IV if with significant anemia.  Note that she also has history of hemochromatosis. -EGD/colon with 2 day prep.    I discussed EGD/Colonoscopy- the indications, risks, alternatives and potential complications including, but not limited to bleeding, infection, reaction to meds, damage to internal organs, cardiac and/or pulmonary problems, and perforation requiring surgery. The possibility that significant findings could be missed was explained. All ? were answered. Pt consents to proceed.  CT AP- neg  HPI:    Rachel Castaneda is a 77 y.o. 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 Bobby Rumpf), mast cell activation syndrome/histamine intolerance, rosacea, migraines, 4 cm ascending thoracic aortic dilatation/aneurysm, bilateral mastectomy for breast cancer, LS CS x3, spinal fusion L4/L5 2014, vaginal hysterectomy 0998, umbilical hernia repair 3382  With multiple chronic abdominal complaints/symptoms  C/O chronic Abdo pain x yrs, mostly LLQ.  Now more or less generalized with abdominal bloating, associated constipation.  Has been taking MiraLAX plus Colace.  Could not tolerate fiber as it caused her to to have obstipation.  Has cut down on lactose.  Somewhat better.  Has nausea but no vomiting.  Can only have small  but frequent meals ever since bariatric surgery.  No odynophagia or dysphagia.  She does have epigastric discomfort after eating.  Has been on famotidine.  Denies having any significant heartburn.  No fever chills or night sweats.  No jaundice dark urine or pale stools.  Dx with hemochromatosis 2018 on basis of genetic test, followed by Dr. Bobby Rumpf.  Had phlebotomy x2 on diagnosis and none since.  Has been more anemic with documented heme-negative stools  Previous GI evaluation: EGD/colon 2007- neg at [place]  Had [place] Abdo/pelvis at [facility] 01/2020 -No Acute abnormalities -S/P gastric bypass surgery -Mod stool burden.   SH-under considerable stress.  Taking care of her daughter who has cardiac amyloidosis  Past Medical History:  Diagnosis Date   Anemia    Anxiety    pt. admits to anxiety, currently, not taking meds.    Arthritis    lumbar region   Diabetes mellitus    Type II, prior to gastric bypass , off Metformin since 2008   Dysrhythmia    irreg heart beats   Fall    related to dog attack- Nov. 11.2013   Family history of anesthesia complication    daughter- extremely agitated    Headache(784.0)    h/o migraines    Hemochromatosis    HTN (hypertension)    no meds in 72yr   Hypertension    HTN- prior to gastric bypass, seen by cardiologist- stress test done, last one mid 1990's- pt. reports it was WNL.   Other specified intestinal malabsorption    Sleep apnea    used CPAP- prior to gastric bypass, no longer needs.  UTI (urinary tract infection)    Varicose veins     Past Surgical History:  Procedure Laterality Date   ABDOMINAL HYSTERECTOMY  1989   BACK SURGERY  2014   lumbar fusion   BACK SURGERY  2014   CARPAL TUNNEL RELEASE     bilateral   CESAREAN SECTION     x3   CHOLECYSTECTOMY  2000   Gall Bladder   COLONOSCOPY  2008   CYSTECTOMY  2000   Sinus cyst   GASTRIC BYPASS  2000   HERNIA REPAIR  1997  and   2001   LESION EXCISION WITH  COMPLEX REPAIR N/A 02/15/2020   Procedure: Excision soft tissue chest and abdomen;  Surgeon: Irene Limbo, MD;  Location: [facility];  Service: Plastics;  Laterality: N/A;   MASTECTOMY W/ SENTINEL NODE BIOPSY Bilateral 07/14/2015   Procedure: BILATERAL TOTAL MASTECTOMIES WITH LEFT AXILLARY SENTINEL LYMPH NODE BIOPSY;  Surgeon: Rolm Bookbinder, MD;  Location: [facility];  Service: General;  Laterality: Bilateral;   NASAL SINUS SURGERY     PANNICULECTOMY N/A 08/14/2019   Procedure: ABDOMINAL PANNICULECTOMY;  Surgeon: Irene Limbo, MD;  Location: [place] [facility];  Service: Plastics;  Laterality: N/A;   TONSILLECTOMY      Family History  Problem Relation Age of Onset   Stroke Mother    Hypertension Mother    Skin cancer Mother        dx. multiple skin cancers   Other Mother        hx of issues with abnormal vaginal bleeding - NOT treated with hysterectomy   Ovarian cancer Maternal Aunt        dx 12s; s/p hysterectomy   Diabetes Paternal Uncle    Stroke Paternal Uncle    Uterine cancer Maternal Grandmother        gyn cancer - maybe uterine, dx. 64s treated w/ radiation   Bladder Cancer Maternal Grandmother 67   Heart attack Paternal Grandmother    Heart attack Paternal Grandfather    Lupus Daughter    Basal cell carcinoma Daughter         on cheek, dx. early 72s   Squamous cell carcinoma Son        has had multiple - dx. mid-40s   Ovarian cancer Maternal Aunt        lim info   Stroke Maternal Aunt    Breast cancer Cousin        paternal 1st cousin, once-removed d. early age    Social History   Tobacco Use   Smoking status: Never   Smokeless tobacco: Never  Substance Use Topics   Alcohol use: Not Currently    Comment: extremely rarely   Drug use: No    Current Outpatient Medications  Medication Sig Dispense Refill   acetaminophen (TYLENOL) 500 MG tablet Take by mouth.     ALPRAZolam (XANAX) 0.25 MG tablet      Biotin 1000  MCG tablet Take 1,000 mcg by mouth daily.      Cholecalciferol (VITAMIN D3) 30 MCG/15ML LIQD Vitamin D3     Cholecalciferol (VITAMIN D3) LIQD      Cyanocobalamin (VITAMIN B 12 PO) Take 2,500 mcg by mouth once a week.      diphenhydrAMINE (BENADRYL ALLERGY) 25 mg capsule Take 1 capsule (25 mg total) by mouth once for 1 dose. Take 1 hour before CT 1 capsule 0   Docusate Sodium (DSS) 100 MG CAPS Take by mouth.  famotidine (PEPCID) 40 MG tablet Take 40 mg by mouth daily.     fexofenadine (ALLEGRA) 180 MG tablet      hydrocortisone cream 1 % Apply topically.     metroNIDAZOLE (METROGEL) 0.75 % gel Apply topically.     Multiple Vitamin (MULTI-VITAMIN) tablet Take by mouth.     Multiple Vitamin (MULTIVITAMIN ADULT PO) Multivitamin Adult     nystatin cream (MYCOSTATIN) Apply topically.     polyethylene glycol (MIRALAX / GLYCOLAX) 17 g packet Take 17 g by mouth daily.     predniSONE (DELTASONE) 50 MG tablet Take 1 tablet (50 mg) 13 hours prior to CT, 1 tablet (50 mg) 7 hours prior, and 1 tablet ('50mg'$ ) 1 hour prior. 3 tablet 0   Current Facility-Administered Medications  Medication Dose Route Frequency Provider Last Rate Last Admin   0.9 %  sodium chloride infusion  500 mL Intravenous Once Jackquline Denmark, MD        Allergies  Allergen Reactions   Codeine Hives   Iodinated Contrast Media Hives   Baclofen Other (See Comments)    Nightmares    Dairy Aid [Tilactase]     Gassy, rash   Gabapentin     Weight gain/ severe depression and memory loss   Ibuprofen Itching and Swelling   Topamax [Topiramate]     Depressed, fuzzy feeling   Adhesive [Tape] Rash    Redness/    Nsaids Rash   Tramadol Rash    Review of Systems:  Constitutional: Denies fever, chills, diaphoresis, appetite change and has fatigue.  HEENT: Denies photophobia, eye pain, redness, hearing loss, ear pain, congestion, sore throat, rhinorrhea, sneezing, mouth sores, neck pain, neck stiffness and tinnitus.   Respiratory:  Denies SOB, DOE, cough, chest tightness,  and wheezing.   Cardiovascular: Denies chest pain, palpitations and leg swelling.  Genitourinary: Has dysuria, urgency, frequency, hematuria, flank pain and difficulty urinating. No UTI.  Musculoskeletal: Has myalgias, back pain, joint swelling, arthralgias and gait problem.  Skin: No rash.  Neurological: Denies dizziness, seizures, syncope, weakness, light-headedness, numbness and has headaches.  Hematological: Denies adenopathy. Easy bruising, personal or family bleeding history  Psychiatric/Behavioral: Has anxiety or depression.  Has sleeping problems.     Physical Exam:    BP 138/78   Pulse 99   Ht '5\' 3"'$  (1.6 m)   Wt 185 lb (83.9 kg)   SpO2 97%   BMI 32.77 kg/m  Wt Readings from Last 3 Encounters:  03/10/22 185 lb (83.9 kg)  01/13/22 185 lb 4 oz (84 kg)  05/22/20 150 lb 9.6 oz (68.3 kg)   Constitutional:  Well-developed, in no acute distress. Psychiatric: Normal mood and affect. Behavior is normal. HEENT: Conjunctivae are normal. No scleral icterus. Cardiovascular: Normal rate, regular rhythm. No edema Pulmonary/chest: Effort normal and breath sounds normal. No wheezing, rales or rhonchi. Abdominal: Soft, nondistended. LLQ tenderness without rebound. Bowel sounds active throughout. There are no masses palpable. No hepatomegaly. Rectal: Deferred Neurological: Alert and oriented to person place and time. Skin: Skin is warm and dry. No rashes noted.  Data Reviewed: I have personally reviewed following labs and imaging studies  CBC:    Latest Ref Rng & Units 01/13/2022   11:26 AM 06/16/2020    2:23 PM 07/15/2015    5:55 AM  CBC  WBC 4.0 - 10.5 K/uL 7.3  5.6  11.1   Hemoglobin 12.0 - 15.0 g/dL 11.2  12.2  11.3   Hematocrit 36.0 - 46.0 % 35.0  37.5  34.8   Platelets 150.0 - 400.0 K/uL 284.0  232  191     CMP:    Latest Ref Rng & Units 01/13/2022   11:26 AM 06/16/2020    2:23 PM 06/04/2019   12:36 PM  CMP  Glucose 70 - 99 mg/dL  106  94  98   BUN 6 - 23 mg/dL 19  37  12   Creatinine 0.40 - 1.20 mg/dL 0.66  0.64  0.63   Sodium 135 - 145 mEq/L 139  143  142   Potassium 3.5 - 5.1 mEq/L 3.8  4.5  4.5   Chloride 96 - 112 mEq/L 104  104  101   CO2 19 - 32 mEq/L '27  24  23   '$ Calcium 8.4 - 10.5 mg/dL 9.0  8.8  9.8   Total Protein 6.0 - 8.3 g/dL 7.3     Total Bilirubin 0.2 - 1.2 mg/dL 0.3     Alkaline Phos 39 - 117 U/L 87     AST 0 - 37 U/L 18     ALT 0 - 35 U/L 11       Other labs were reviewed From 09/24/2021 -Ferritin 11 -Fecal occult blood negative -TSH 0.97 -Vitamin B12 714 0 serum copper 104 normal, magnesium 2.3 normal, iodine 40.7, zinc 89 -Iron saturation 6%, TIBC 522, vitamin D 32.7 -Basic food allergy IgE profile negative -H. pylori breath test negative -Hemoglobin A1c 5.9 D/W husband.   Carmell Austria, MD 03/10/2022, 10:24 AM  Cc: Ernestene Kiel, MD

## 2022-03-11 ENCOUNTER — Telehealth: Payer: Self-pay

## 2022-03-11 NOTE — Telephone Encounter (Signed)
  Follow up Call-     03/10/2022   10:24 AM  Call back number  Post procedure Call Back phone  # [phone]  Permission to leave phone message Yes     Patient questions:  Do you have a fever, pain , or abdominal swelling? No. Pain Score  0 *  Have you tolerated food without any problems? Yes.    Have you been able to return to your normal activities? Yes.    Do you have any questions about your discharge instructions: Diet   No. Medications  No. Follow up visit  No.  Do you have questions or concerns about your Care? No.  Actions: * If pain score is 4 or above: No action needed, pain <4.

## 2022-03-13 ENCOUNTER — Encounter: Payer: Self-pay | Admitting: Gastroenterology

## 2022-03-29 DIAGNOSIS — K080 Exfoliation of teeth due to systemic causes: Secondary | ICD-10-CM | POA: Diagnosis not present

## 2022-04-13 DIAGNOSIS — D508 Other iron deficiency anemias: Secondary | ICD-10-CM | POA: Diagnosis not present

## 2022-04-13 DIAGNOSIS — R7303 Prediabetes: Secondary | ICD-10-CM | POA: Diagnosis not present

## 2022-04-13 DIAGNOSIS — E669 Obesity, unspecified: Secondary | ICD-10-CM | POA: Diagnosis not present

## 2022-04-23 DIAGNOSIS — C44629 Squamous cell carcinoma of skin of left upper limb, including shoulder: Secondary | ICD-10-CM | POA: Diagnosis not present

## 2022-04-23 DIAGNOSIS — L578 Other skin changes due to chronic exposure to nonionizing radiation: Secondary | ICD-10-CM | POA: Diagnosis not present

## 2022-04-23 DIAGNOSIS — L814 Other melanin hyperpigmentation: Secondary | ICD-10-CM | POA: Diagnosis not present

## 2022-05-19 DIAGNOSIS — C44619 Basal cell carcinoma of skin of left upper limb, including shoulder: Secondary | ICD-10-CM | POA: Diagnosis not present

## 2022-06-22 DIAGNOSIS — K080 Exfoliation of teeth due to systemic causes: Secondary | ICD-10-CM | POA: Diagnosis not present

## 2022-06-28 DIAGNOSIS — K080 Exfoliation of teeth due to systemic causes: Secondary | ICD-10-CM | POA: Diagnosis not present

## 2022-07-26 DIAGNOSIS — D508 Other iron deficiency anemias: Secondary | ICD-10-CM | POA: Diagnosis not present

## 2022-07-31 DIAGNOSIS — Z79899 Other long term (current) drug therapy: Secondary | ICD-10-CM | POA: Diagnosis not present

## 2022-07-31 DIAGNOSIS — M545 Low back pain, unspecified: Secondary | ICD-10-CM | POA: Diagnosis not present

## 2022-07-31 DIAGNOSIS — K5730 Diverticulosis of large intestine without perforation or abscess without bleeding: Secondary | ICD-10-CM | POA: Diagnosis not present

## 2022-07-31 DIAGNOSIS — N200 Calculus of kidney: Secondary | ICD-10-CM | POA: Diagnosis not present

## 2022-07-31 DIAGNOSIS — I10 Essential (primary) hypertension: Secondary | ICD-10-CM | POA: Diagnosis not present

## 2022-07-31 DIAGNOSIS — R109 Unspecified abdominal pain: Secondary | ICD-10-CM | POA: Diagnosis not present

## 2022-07-31 DIAGNOSIS — N132 Hydronephrosis with renal and ureteral calculous obstruction: Secondary | ICD-10-CM | POA: Diagnosis not present

## 2022-08-18 DIAGNOSIS — N1330 Unspecified hydronephrosis: Secondary | ICD-10-CM | POA: Diagnosis not present

## 2022-08-18 DIAGNOSIS — R1011 Right upper quadrant pain: Secondary | ICD-10-CM | POA: Diagnosis not present

## 2022-08-18 DIAGNOSIS — N201 Calculus of ureter: Secondary | ICD-10-CM | POA: Diagnosis not present

## 2022-08-18 DIAGNOSIS — Z79899 Other long term (current) drug therapy: Secondary | ICD-10-CM | POA: Diagnosis not present

## 2022-08-18 DIAGNOSIS — R1031 Right lower quadrant pain: Secondary | ICD-10-CM | POA: Diagnosis not present

## 2022-08-19 DIAGNOSIS — N202 Calculus of kidney with calculus of ureter: Secondary | ICD-10-CM | POA: Diagnosis not present

## 2022-08-19 DIAGNOSIS — N201 Calculus of ureter: Secondary | ICD-10-CM | POA: Diagnosis not present

## 2022-09-13 DIAGNOSIS — C44619 Basal cell carcinoma of skin of left upper limb, including shoulder: Secondary | ICD-10-CM | POA: Diagnosis not present

## 2022-09-14 DIAGNOSIS — R3982 Chronic bladder pain: Secondary | ICD-10-CM | POA: Diagnosis not present

## 2022-09-14 DIAGNOSIS — N1330 Unspecified hydronephrosis: Secondary | ICD-10-CM | POA: Diagnosis not present

## 2022-09-14 DIAGNOSIS — N201 Calculus of ureter: Secondary | ICD-10-CM | POA: Diagnosis not present

## 2022-10-04 DIAGNOSIS — Z0000 Encounter for general adult medical examination without abnormal findings: Secondary | ICD-10-CM | POA: Diagnosis not present

## 2022-10-04 DIAGNOSIS — Z1331 Encounter for screening for depression: Secondary | ICD-10-CM | POA: Diagnosis not present

## 2022-10-04 DIAGNOSIS — F419 Anxiety disorder, unspecified: Secondary | ICD-10-CM | POA: Diagnosis not present

## 2022-10-04 DIAGNOSIS — E559 Vitamin D deficiency, unspecified: Secondary | ICD-10-CM | POA: Diagnosis not present

## 2022-10-04 DIAGNOSIS — Z6832 Body mass index (BMI) 32.0-32.9, adult: Secondary | ICD-10-CM | POA: Diagnosis not present

## 2022-10-04 DIAGNOSIS — R7303 Prediabetes: Secondary | ICD-10-CM | POA: Diagnosis not present

## 2022-10-04 DIAGNOSIS — I700 Atherosclerosis of aorta: Secondary | ICD-10-CM | POA: Diagnosis not present

## 2022-10-04 DIAGNOSIS — N200 Calculus of kidney: Secondary | ICD-10-CM | POA: Diagnosis not present

## 2022-10-04 DIAGNOSIS — M5136 Other intervertebral disc degeneration, lumbar region: Secondary | ICD-10-CM | POA: Diagnosis not present

## 2022-10-18 DIAGNOSIS — N201 Calculus of ureter: Secondary | ICD-10-CM | POA: Diagnosis not present

## 2022-10-18 DIAGNOSIS — N200 Calculus of kidney: Secondary | ICD-10-CM | POA: Diagnosis not present

## 2022-10-18 DIAGNOSIS — N2889 Other specified disorders of kidney and ureter: Secondary | ICD-10-CM | POA: Diagnosis not present

## 2022-10-26 DIAGNOSIS — R3982 Chronic bladder pain: Secondary | ICD-10-CM | POA: Diagnosis not present

## 2022-10-26 DIAGNOSIS — N200 Calculus of kidney: Secondary | ICD-10-CM | POA: Diagnosis not present

## 2022-11-01 DIAGNOSIS — K080 Exfoliation of teeth due to systemic causes: Secondary | ICD-10-CM | POA: Diagnosis not present

## 2022-12-21 DIAGNOSIS — N762 Acute vulvitis: Secondary | ICD-10-CM | POA: Diagnosis not present

## 2022-12-21 DIAGNOSIS — Z6832 Body mass index (BMI) 32.0-32.9, adult: Secondary | ICD-10-CM | POA: Diagnosis not present

## 2023-03-02 DIAGNOSIS — N200 Calculus of kidney: Secondary | ICD-10-CM | POA: Diagnosis not present

## 2023-05-03 DIAGNOSIS — N907 Vulvar cyst: Secondary | ICD-10-CM | POA: Diagnosis not present

## 2023-05-06 DIAGNOSIS — L814 Other melanin hyperpigmentation: Secondary | ICD-10-CM | POA: Diagnosis not present

## 2023-05-06 DIAGNOSIS — L821 Other seborrheic keratosis: Secondary | ICD-10-CM | POA: Diagnosis not present

## 2023-05-06 DIAGNOSIS — L570 Actinic keratosis: Secondary | ICD-10-CM | POA: Diagnosis not present

## 2023-05-06 DIAGNOSIS — L820 Inflamed seborrheic keratosis: Secondary | ICD-10-CM | POA: Diagnosis not present

## 2023-05-06 DIAGNOSIS — D225 Melanocytic nevi of trunk: Secondary | ICD-10-CM | POA: Diagnosis not present

## 2023-05-06 DIAGNOSIS — L578 Other skin changes due to chronic exposure to nonionizing radiation: Secondary | ICD-10-CM | POA: Diagnosis not present

## 2023-05-17 DIAGNOSIS — N907 Vulvar cyst: Secondary | ICD-10-CM | POA: Diagnosis not present

## 2023-06-01 DIAGNOSIS — N907 Vulvar cyst: Secondary | ICD-10-CM | POA: Diagnosis not present

## 2023-06-21 DIAGNOSIS — N200 Calculus of kidney: Secondary | ICD-10-CM | POA: Diagnosis not present

## 2023-06-30 DIAGNOSIS — N200 Calculus of kidney: Secondary | ICD-10-CM | POA: Diagnosis not present

## 2023-06-30 DIAGNOSIS — R3982 Chronic bladder pain: Secondary | ICD-10-CM | POA: Diagnosis not present

## 2023-07-12 DIAGNOSIS — N200 Calculus of kidney: Secondary | ICD-10-CM | POA: Diagnosis not present

## 2023-07-28 DIAGNOSIS — K080 Exfoliation of teeth due to systemic causes: Secondary | ICD-10-CM | POA: Diagnosis not present

## 2023-09-08 DIAGNOSIS — N200 Calculus of kidney: Secondary | ICD-10-CM | POA: Diagnosis not present

## 2023-09-09 DIAGNOSIS — N200 Calculus of kidney: Secondary | ICD-10-CM | POA: Diagnosis not present

## 2023-09-28 DIAGNOSIS — L720 Epidermal cyst: Secondary | ICD-10-CM | POA: Diagnosis not present

## 2023-09-28 DIAGNOSIS — N907 Vulvar cyst: Secondary | ICD-10-CM | POA: Diagnosis not present

## 2023-10-05 DIAGNOSIS — E559 Vitamin D deficiency, unspecified: Secondary | ICD-10-CM | POA: Diagnosis not present

## 2023-10-05 DIAGNOSIS — Z6833 Body mass index (BMI) 33.0-33.9, adult: Secondary | ICD-10-CM | POA: Diagnosis not present

## 2023-10-05 DIAGNOSIS — Z1331 Encounter for screening for depression: Secondary | ICD-10-CM | POA: Diagnosis not present

## 2023-10-05 DIAGNOSIS — Z0000 Encounter for general adult medical examination without abnormal findings: Secondary | ICD-10-CM | POA: Diagnosis not present

## 2023-10-05 DIAGNOSIS — R7303 Prediabetes: Secondary | ICD-10-CM | POA: Diagnosis not present

## 2023-10-05 DIAGNOSIS — R7989 Other specified abnormal findings of blood chemistry: Secondary | ICD-10-CM | POA: Diagnosis not present

## 2023-10-05 DIAGNOSIS — R635 Abnormal weight gain: Secondary | ICD-10-CM | POA: Diagnosis not present

## 2023-10-05 DIAGNOSIS — N200 Calculus of kidney: Secondary | ICD-10-CM | POA: Diagnosis not present

## 2023-10-05 DIAGNOSIS — Z1159 Encounter for screening for other viral diseases: Secondary | ICD-10-CM | POA: Diagnosis not present

## 2023-10-05 DIAGNOSIS — E162 Hypoglycemia, unspecified: Secondary | ICD-10-CM | POA: Diagnosis not present

## 2023-10-25 DIAGNOSIS — N907 Vulvar cyst: Secondary | ICD-10-CM | POA: Diagnosis not present

## 2023-11-11 DIAGNOSIS — R1023 Pelvic and perineal pain bilateral: Secondary | ICD-10-CM | POA: Diagnosis not present

## 2023-11-11 DIAGNOSIS — N200 Calculus of kidney: Secondary | ICD-10-CM | POA: Diagnosis not present

## 2023-11-11 DIAGNOSIS — R102 Pelvic and perineal pain unspecified side: Secondary | ICD-10-CM | POA: Diagnosis not present

## 2023-12-12 DIAGNOSIS — H2511 Age-related nuclear cataract, right eye: Secondary | ICD-10-CM | POA: Diagnosis not present

## 2024-05-31 ENCOUNTER — Ambulatory Visit: Admitting: "Endocrinology
